# Patient Record
Sex: Male | Born: 1970 | Race: White | Hispanic: No | Marital: Single | Smoking: Former smoker
Health system: Southern US, Community
[De-identification: ages and names within clinical notes are randomized; demographics above are authoritative.]

## PROBLEM LIST (undated history)

## (undated) DIAGNOSIS — E781 Pure hyperglyceridemia: Secondary | ICD-10-CM

## (undated) DIAGNOSIS — K635 Polyp of colon: Secondary | ICD-10-CM

## (undated) DIAGNOSIS — Z9989 Dependence on other enabling machines and devices: Secondary | ICD-10-CM

## (undated) DIAGNOSIS — R7989 Other specified abnormal findings of blood chemistry: Secondary | ICD-10-CM

## (undated) DIAGNOSIS — G4733 Obstructive sleep apnea (adult) (pediatric): Secondary | ICD-10-CM

## (undated) DIAGNOSIS — K5090 Crohn's disease, unspecified, without complications: Secondary | ICD-10-CM

## (undated) HISTORY — DX: Polyp of colon: K63.5

## (undated) HISTORY — PX: COLON SURGERY: SHX602

## (undated) HISTORY — DX: Pure hyperglyceridemia: E78.1

## (undated) HISTORY — DX: Other specified abnormal findings of blood chemistry: R79.89

---

## 1999-03-23 ENCOUNTER — Encounter: Payer: Self-pay | Admitting: Emergency Medicine

## 1999-03-23 ENCOUNTER — Emergency Department (HOSPITAL_COMMUNITY): Admission: EM | Admit: 1999-03-23 | Discharge: 1999-03-23 | Payer: Self-pay | Admitting: Emergency Medicine

## 2004-03-04 ENCOUNTER — Ambulatory Visit: Payer: Self-pay | Admitting: Unknown Physician Specialty

## 2004-04-16 ENCOUNTER — Ambulatory Visit: Payer: Self-pay | Admitting: Internal Medicine

## 2004-04-16 ENCOUNTER — Inpatient Hospital Stay (HOSPITAL_COMMUNITY): Admission: EM | Admit: 2004-04-16 | Discharge: 2004-04-19 | Payer: Self-pay | Admitting: *Deleted

## 2004-04-19 ENCOUNTER — Encounter (INDEPENDENT_AMBULATORY_CARE_PROVIDER_SITE_OTHER): Payer: Self-pay | Admitting: *Deleted

## 2004-04-19 ENCOUNTER — Encounter: Payer: Self-pay | Admitting: Internal Medicine

## 2004-05-01 ENCOUNTER — Ambulatory Visit: Payer: Self-pay | Admitting: Internal Medicine

## 2004-08-05 ENCOUNTER — Ambulatory Visit: Payer: Self-pay | Admitting: Unknown Physician Specialty

## 2005-07-06 ENCOUNTER — Inpatient Hospital Stay (HOSPITAL_COMMUNITY): Admission: EM | Admit: 2005-07-06 | Discharge: 2005-07-09 | Payer: Self-pay | Admitting: Emergency Medicine

## 2005-07-09 ENCOUNTER — Ambulatory Visit: Payer: Self-pay | Admitting: Gastroenterology

## 2005-07-24 ENCOUNTER — Ambulatory Visit: Payer: Self-pay | Admitting: Internal Medicine

## 2005-08-26 ENCOUNTER — Ambulatory Visit: Payer: Self-pay | Admitting: Internal Medicine

## 2005-11-01 ENCOUNTER — Inpatient Hospital Stay (HOSPITAL_COMMUNITY): Admission: EM | Admit: 2005-11-01 | Discharge: 2005-11-04 | Payer: Self-pay | Admitting: Emergency Medicine

## 2005-11-05 ENCOUNTER — Ambulatory Visit: Payer: Self-pay | Admitting: Internal Medicine

## 2006-02-24 ENCOUNTER — Ambulatory Visit: Payer: Self-pay | Admitting: Internal Medicine

## 2006-02-24 LAB — CONVERTED CEMR LAB
AST: 60 units/L — ABNORMAL HIGH (ref 0–37)
BUN: 21 mg/dL (ref 6–23)
Bilirubin, Direct: 0.1 mg/dL (ref 0.0–0.3)
CO2: 25 meq/L (ref 19–32)
Calcium: 9.1 mg/dL (ref 8.4–10.5)
Chloride: 107 meq/L (ref 96–112)
Creatinine, Ser: 1.1 mg/dL (ref 0.4–1.5)
Eosinophils Absolute: 0 10*3/uL (ref 0.0–0.6)
GFR calc Af Amer: 99 mL/min
GFR calc non Af Amer: 82 mL/min
HCT: 39.4 % (ref 39.0–52.0)
Lymphocytes Relative: 4.8 % — ABNORMAL LOW (ref 12.0–46.0)
MCV: 90.2 fL (ref 78.0–100.0)
Monocytes Relative: 1.3 % — ABNORMAL LOW (ref 3.0–11.0)
Neutrophils Relative %: 93.1 % — ABNORMAL HIGH (ref 43.0–77.0)
Platelets: 431 10*3/uL — ABNORMAL HIGH (ref 150–400)
Potassium: 4.3 meq/L (ref 3.5–5.1)
Total Bilirubin: 0.4 mg/dL (ref 0.3–1.2)
Total Protein: 6.8 g/dL (ref 6.0–8.3)

## 2006-05-05 ENCOUNTER — Ambulatory Visit: Payer: Self-pay | Admitting: Internal Medicine

## 2006-05-05 LAB — CONVERTED CEMR LAB
Basophils Absolute: 0 10*3/uL (ref 0.0–0.1)
Basophils Relative: 0.1 % (ref 0.0–1.0)
Eosinophils Absolute: 0.2 10*3/uL (ref 0.0–0.6)
Neutro Abs: 5.9 10*3/uL (ref 1.4–7.7)
Platelets: 333 10*3/uL (ref 150–400)

## 2006-05-11 ENCOUNTER — Observation Stay (HOSPITAL_COMMUNITY): Admission: EM | Admit: 2006-05-11 | Discharge: 2006-05-12 | Payer: Self-pay | Admitting: Emergency Medicine

## 2006-05-19 ENCOUNTER — Ambulatory Visit: Payer: Self-pay | Admitting: Internal Medicine

## 2006-05-22 ENCOUNTER — Ambulatory Visit: Payer: Self-pay | Admitting: Internal Medicine

## 2006-06-16 ENCOUNTER — Ambulatory Visit: Payer: Self-pay | Admitting: Internal Medicine

## 2006-06-16 LAB — CONVERTED CEMR LAB
Basophils Absolute: 0.2 10*3/uL — ABNORMAL HIGH (ref 0.0–0.1)
Eosinophils Relative: 0.1 % (ref 0.0–5.0)
HCT: 39.3 % (ref 39.0–52.0)
Hemoglobin: 13.9 g/dL (ref 13.0–17.0)
MCHC: 35.3 g/dL (ref 30.0–36.0)
MCV: 90.2 fL (ref 78.0–100.0)
Monocytes Absolute: 0.5 10*3/uL (ref 0.2–0.7)
Monocytes Relative: 3.1 % (ref 3.0–11.0)
RBC: 4.35 M/uL (ref 4.22–5.81)

## 2006-06-23 ENCOUNTER — Ambulatory Visit: Payer: Self-pay | Admitting: Internal Medicine

## 2006-07-13 ENCOUNTER — Ambulatory Visit: Payer: Self-pay | Admitting: Internal Medicine

## 2006-09-17 ENCOUNTER — Inpatient Hospital Stay (HOSPITAL_COMMUNITY): Admission: EM | Admit: 2006-09-17 | Discharge: 2006-09-22 | Payer: Self-pay | Admitting: Emergency Medicine

## 2006-09-18 ENCOUNTER — Encounter (INDEPENDENT_AMBULATORY_CARE_PROVIDER_SITE_OTHER): Payer: Self-pay | Admitting: Surgery

## 2006-09-24 ENCOUNTER — Ambulatory Visit: Payer: Self-pay | Admitting: Internal Medicine

## 2006-10-12 ENCOUNTER — Inpatient Hospital Stay (HOSPITAL_COMMUNITY): Admission: EM | Admit: 2006-10-12 | Discharge: 2006-10-16 | Payer: Self-pay | Admitting: Emergency Medicine

## 2007-06-12 DIAGNOSIS — K5090 Crohn's disease, unspecified, without complications: Secondary | ICD-10-CM | POA: Insufficient documentation

## 2007-06-12 DIAGNOSIS — G4730 Sleep apnea, unspecified: Secondary | ICD-10-CM | POA: Insufficient documentation

## 2008-10-17 ENCOUNTER — Encounter: Admission: RE | Admit: 2008-10-17 | Discharge: 2008-10-17 | Payer: Self-pay | Admitting: Internal Medicine

## 2009-01-21 ENCOUNTER — Emergency Department (HOSPITAL_COMMUNITY): Admission: EM | Admit: 2009-01-21 | Discharge: 2009-01-21 | Payer: Self-pay | Admitting: Emergency Medicine

## 2010-06-11 NOTE — Discharge Summary (Signed)
[name], [name]              ACCOUNT NO.:  [id number]   MEDICAL RECORD NO.:  [id number]          PATIENT TYPE:  OBV   LOCATION:  1504                         FACILITY:  [facility]   PHYSICIAN:  Iva Boop, MD,FACGDATE OF BIRTH:  1970/06/02   DATE OF ADMISSION:  05/11/2006  DATE OF DISCHARGE:  05/12/2006                               DISCHARGE SUMMARY   ADMITTING DIAGNOSES:  1. [age]-year-old white male with known Crohn's ileitis with      stricture at previous anastomosis and 1 other segment of narrowing      in the distal small bowel, who presents with abdominal pain, rule      out recurrent obstruction secondary to active Crohn's versus      cicatrization.  2. Status post ileocectomy 19 years ago.  3. History of medical noncompliance.   DISCHARGE DIAGNOSES:  1. Abdominal pain secondary to active ileitis with low-grade      obstructive symptoms.  2. Other diagnoses as listed above.   CONSULTATIONS:  None.   PROCEDURES:  CT enterography.   BRIEF HISTORY:  [name] is a 40 year old white male known to Dr. Marina Goodell  with history of ileal Crohn's disease.  He is status post remote  ileocectomy done about 19 years ago.  He has had problems with recurrent  partial small-bowel obstruction.  He was felt to have a combination of  fixed stenotic disease and superimposed inflammatory disease.  He also  has a history of noncompliance with medications.  His last course of  prednisone was started in January 2008 and he was tapered off by mid  March.  He around that time apparently also ran out of 6-MP and had  been off of this for about 2 weeks.  He had seen Dr. Marina Goodell back in the  office and started back on the 6-MP on April 8 and says he has been  doing well.  On April 11, he had onset of right mid quadrant abdominal  pain, which he says feels like his Crohn's pain and obstructive pain.  This has persisted over the past 4 days, has been intermittent and wave-  like, no  associated nausea or vomiting.  He has had loose stools without  melena or hematochezia.  He has placed himself on a liquid diet over  this past weekend and then called our office with the above symptoms; he  was asked to come to the emergency room for further evaluation and x-  rays to rule out recurrent obstruction.   LABORATORY STUDIES:  On April 14, WBC of 12.2, hemoglobin 14.2,  hematocrit of 39.8, MCV of 86, platelets 376,000; electrolytes within  normal limits; potassium was 3.5, glucose 91, creatinine 0.87; GFR  greater than 60; albumin 4; UA negative.   X-RAY STUDIES:  CT enterography shows evidence of active Crohn's in the  terminal ileum with low-grade obstruction in that area and a second  possible transition in the distal ileum immediate adjacent to the  inflamed terminal ileum; component of small-bowel-to-small-bowel fistula  cannot be entirely excluded in this area.  Pelvis was negative.   HOSPITAL COURSE:  The patient was admitted to the service of Dr. Stan Head.  He was initially placed on a clear liquid diet, given  antibiotics and narcotics as needed, continued on 6-MP at 200 mg daily  and scheduled for CT enterography; this was done on the day of admission  with findings as outlined above.  The following morning, he was feeling  better and felt that he could control his pain with oral medications at  home.  He had not had any nausea or vomiting.  He was allowed discharge  to home with instructions to stay on a soft low-residue diet, to  continue 6-MP at 200 mg per day.  We added prednisone 40 mg q.a.m. due  to what looks like active ileitis on the CT enterography and also gave  him Vicodin 5/500 q.6 h. p.r.n. pain.   FOLLOWUP:  He is to follow up with Dr. Marina Goodell in the office on April 25  at 8:45 a.m., to call for any problems in the interim.      Jay Esterwood, PA-C      Iva Boop, MD,FACG  Electronically Signed    AE/MEDQ  D:  06/16/2006  T:   06/16/2006  Job:  [id number] [id number]

## 2010-06-11 NOTE — H&P (Signed)
NAMEHOMAR, WEINKAUF              ACCOUNT NO.:  [id number]   MEDICAL RECORD NO.:  [id number]          PATIENT TYPE:  INP   LOCATION:  0105                         FACILITY:  [facility]   PHYSICIAN:  Rachael Fee, MD   DATE OF BIRTH:  1970-09-20   DATE OF ADMISSION:  10/12/2006  DATE OF DISCHARGE:                              HISTORY & PHYSICAL   PROBLEM:  Acute abdominal pain, nausea and vomiting.   HISTORY:  Dai is a pleasant 40 year old white male known to Dr.  Marina Goodell who has history of Crohn's ileitis.  He had a remote ileocecectomy  about 19 years ago.  He developed a stricture at the previous  anastomosis and developed recurrent small bowel obstructions.  He  underwent laparoscopic lysis of adhesions on September 18, 2006 by Dr.  Michaell Cowing and also had lap assisted ileocolonic resection and anastomosis.  He resumed his 6-MP at 200 mg per day at the time of discharge on September 22, 2006.  He had been on steroids perioperatively in these were tapered  off.  He was not felt to have any active Crohn's at the time of surgery.  The patient says that he was doing well with no problems until last  evening when he developed mid abdominal pain which gradually progressed  and was associated with nausea and vomiting.  After vomiting briefly, he  felt better and then his symptoms recurred and he developed increased  abdominal distention, bloating and came to the emergency room.  He says  he was having normal bowel movements up to yesterday.  Has not had any  bowel movement today or any flatus.  No fever, chills, etc..  At this  time he is admitted through the ER for pain control and further  diagnostic evaluation as well as surgical consultation.  CT findings at  the time of admission show wall thickening and edema, enteric post right  hemicolectomy.  Also has a 4.4 x 2.7 cm low density fluid collection  along a loop of small bowel,  question within the bowel wall which may  represent a small  abscess.  Small bowel loops were distended proximal to  this area.  Labs show a WBC of 13.8, hemoglobin 14.3, hematocrit of  40.9, potassium 3.3, creatinine 0.9.  Liver function studies normal.   CURRENT MEDICATIONS:  1. He is on Percocet on a p.r.n. basis which has not been requiring.  2. Magnesium 400 mg daily.  3. 6-MP at 200 mg daily.   ALLERGIES:  NO KNOWN DRUG ALLERGIES.   FAMILY HISTORY:  He has a sister with mild Crohn's.   SOCIAL HISTORY:  The patient lives with his mother.  He is a smoker,  half-pack per day and no EtOH.  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  He was hoping to go back to work within the  next 1 to 2 weeks.   REVIEW OF SYSTEMS:  GI: As outlined above.  MUSCULOSKELETAL:  Negative.  CARDIOVASCULAR:  Denies any chest pain or anginal symptoms.  PULMONARY:  Negative for cough, shortness  of breath or sputum production.  GENITOURINARY:  Negative dysuria, urgency or frequency.  All other  review of systems negative.   PAST HISTORY:  1. Pertinent for Crohn's ileitis as outlined above.  2. Obesity.  3. History of sleep apnea.   PHYSICAL EXAMINATION:  GENERAL:  Well-developed obese white male in no  acute distress.  He is alert and oriented x3, uncomfortable.  VITAL SIGNS:  Temperature is 97.6, blood pressure 109/58, pulse in the  90s, sats 96 on room air.  HEENT:  Nontraumatic, normocephalic.  EOMI, PERRLA.  Sclerae anicteric.  NECK:  Neck is supple without nodes.  CARDIOVASCULAR:  Regular rate and rhythm with S1 and S2.  There is no  murmur or gallop.  PULMONARY:  Clear to A%P.  ABDOMEN:  Somewhat distended.  Bowel sounds are present but hypoactive.  He has diffusely tender.  No real focal tenderness.  There is no mass or  hepatosplenomegaly.  He does have a right transverse abdominal  incisional scar which is of benign-appearing RECTAL:  Not done at this  time.  NEUROLOGIC:  Grossly nonfocal.  EXTREMITIES:  Without clubbing,  cyanosis or edema.   IMPRESSION:  1. A 13 old white male with known of Crohn's ileitis, quiescent with 6-      MP who is status post a remote ileocecectomy approximately 19 years      ago and then subsequent anastomotic stricture with history of      recurrent small bowel obstructions.  He underwent lap assisted      lysis of adhesions and ileocolonic resection September 18, 2006, and      now presents with partial small-bowel obstruction and question of      an inner loop abscess.  2. Obesity.  3. Sleep apnea.   PLAN:  The patient is admitted to the service, Dr. Christella Hartigan for pain  control.  He will be kept NPO.  Suspect he will require an NG tube but  wants to wait over the next few hours to see if he has any further  vomiting.  Will start him on IV Cipro and IV Flagyl.  Hold his 6-MP  in  the event that he may need surgery and have obtained surgical  consultation.  Thank you this is a mastoid PAC dictated for Dr. Christella Hartigan.      Mike Gip, PA-C      Rachael Fee, MD  Electronically Signed    AE/MEDQ  D:  10/12/2006  T:  10/12/2006  Job:  [id number]

## 2010-06-11 NOTE — Assessment & Plan Note (Signed)
[facility]                         GASTROENTEROLOGY OFFICE NOTE   NAME:[name], [name]                     MRN:          [id number]  DATE:07/13/2006                            DOB:          1970/09/27    HISTORY:  [name] presents today for followup. He was last evaluated  May 22, 2006, for ongoing management of his ileal Crohn's disease. He  has had problems with right lower quadrant pain and intermittent  obstructive symptoms. At the time of his last visit, he was on 200  mg daily. He was also on prednisone 40 mg daily, which was to be tapered  by 10 mg every two weeks. His last CBC on Jun 16, 2006, was  unremarkable. He follows up at this time.   After coming off of prednisone, he had recurrent problems with pain for  which he placed himself on prednisone 20 mg daily. He did not contact  the office and thought he would wait until this visit to discuss his  maneuver. He has been on 20 mg daily for a few weeks. Despite this, he  still has intermittent discomfort in the right lower quadrant. Mild  nausea, no vomiting. Bowel habits are regular.   PHYSICAL EXAMINATION:  Finds a well-appearing male in no acute distress.  Blood pressure 100/60, heart rate 76, weight is 299 pounds.  HEENT: Sclerae anicteric.  LUNGS:  Are clear.  HEART: Is regular.  ABDOMEN: Soft with tenderness in the right lower quadrant to palpation.  No masses felt. Good bowel sounds heard.   IMPRESSION:  1. Active ileal Crohn's with partial small bowel obstruction due to a      combination of fixed stenotic disease as well as inflammatory      disease.  2. Status post ileo-cecectomy remotely.  3. History of medical noncompliance.   RECOMMENDATIONS:  1. Increase prednisone to 40 mg daily due to persistent symptoms.  2. Check thiopurine metabolites as well as CBC today.  3. Taper prednisone by 10 mg every two weeks.  4. Office followup in six weeks.  5. Discussion  today regarding biologic therapies including Remicade      and Humira as this may be the next step, short of surgery.      Literature with a CD on Humira provided for his review.     Wilhemina Bonito. Marina Goodell, MD  Electronically Signed   JNP/MedQ  DD: 07/13/2006  DT: 07/13/2006  Job #: [id number]   cc:   Velora Heckler, MD

## 2010-06-11 NOTE — H&P (Signed)
NAMEBRONSON, [name]              ACCOUNT NO.:  [id number]   MEDICAL RECORD NO.:  [id number]          PATIENT TYPE:  INP   LOCATION:  1310                         FACILITY:  [facility]   PHYSICIAN:  Wilhemina Bonito. Marina Goodell, MD      DATE OF BIRTH:  May 25, 1970   DATE OF ADMISSION:  09/16/2006  DATE OF DISCHARGE:                              HISTORY & PHYSICAL   CHIEF COMPLAINT:  Nausea, vomiting, consistent with recurrent partial  small-bowel obstruction.   HISTORY OF PRESENT ILLNESS:  [name] is a 40 year old gentleman who  has a long history of Crohn's involving the small bowel.  He underwent  an ileocolectomy about 20 years previously.  In the past couple of  years, he has required hospital admissions for partial small-bowel  obstructions.  This is secondary to both fixed and inflammatory ileal  disease.  The patient has been on and off medications but recently has  been compliant with at 200 mg daily and prednisone 20 mg daily.  The  patient has developed abdominal pain, nausea and vomiting yesterday, and  the symptoms were consistent with recurrent partial small-bowel  obstruction, so he came to the hospital emergency at [facility] and was  admitted by Dr. Yancey Flemings.  ER physicians had obtained a CT scan which  showed partial low grade distal small bowel obstruction and some  stranding in the region of the terminal ileum associated with ileal wall  thickening suggestive of active Crohn's disease.   The patient's latest colonoscopy was performed April 19, 2004 when he  was hospitalized with a Crohn's flare.  I do not have that report  available, but later office notes with Dr. Marina Goodell note that he had both  fixed stenotic disease as well as superimposed inflammatory disease in  the ileum.  Recent thiopurine metabolites were acceptable range.   ALLERGIES:  NO KNOWN DRUG ALLERGIES.   CURRENT MEDICATIONS:  1. 200 mg p.o. daily.  2. Prednisone 20 mg p.o. daily.   PAST MEDICAL  HISTORY:  1. History of small-bowel Crohn's disease.  Status post ileocolectomy      in the 1980s.  2. Obesity.  3. Obstructive sleep apnea.  In the past, he has used CPAP      nocturnally, but he has not used this in many years.  4. Ongoing tobacco abuse.   FAMILY HISTORY:  He has a sister who has mild Crohn's disease.  She is  not on medicines, and she has never required surgery.   SOCIAL HISTORY:  The patient lives with his mother in [place].  He  smokes a few cigarettes a day.  He does not drink alcoholic beverages.   REVIEW OF SYSTEMS:  No headaches.  No visual changes.  No shortness of  breath.  No cough.  No significant weight loss of more than 5 pounds.  His bowels generally moving about 2-3 times a day and are soft and  sometimes looser.  He has not had a bowel movement since the afternoon  of August 21 which is the day of admission.  There has been no  blood and  his stools.  Next, there are no rashes, no unusual dermatologic or ENT  bleeding.  No dizziness, no presyncope.  No weakness.  No fevers, no  chills.  No dysuria, no urinary frequency.   PHYSICAL EXAMINATION:  Temperature is 99.8, blood pressure 131/67, pulse  is 85, respirations 20, temperature 96%.  The patient is an obese,  pleasant, white male who is in no distress currently.  He appears well.  HEENT EXAM:  Sclerae are nonicteric.  Conjunctiva is pink.  Extraocular  movements are intact.  Oropharynx:  The oral mucosa is moist and clear.  Dentition in good repair.  NECK:  There is no JVD, no masses, no thyromegaly.  The neck is large  overall.  PULMONARY:  The chest is clear to auscultation and percussion  bilaterally.  There is no dyspnea with speech or exertion.  CARDIOVASCULAR:  There is regular rate and rhythm.  No murmurs, rubs or  gallops.  ABDOMEN:  Is obese.  Bowel sounds are active.  There is tenderness  without guarding or rebound in the right lower quadrant.  No masses, no  bruits, no  hepatosplenomegaly.  RECTAL EXAM AND GENITOURINARY EXAMS:  Were deferred.  EXTREMITIES:  There was no cyanosis, clubbing or edema.  Dorsalis pedal  pulses 3+ bilaterally.  PSYCHIATRIC:  The patient is cooperative.  Does not appear depressed.  NEUROLOGIC:  There is no tremor.  The patient moves all fours easily.  Strength in the upper and lower extremities is full.   LABORATORY DATA:  Lipase level 20.  Sodium 138, potassium 3.7, chloride  103, CO2 27.  Glucose 105, BUN 10, creatinine 1.0, total bilirubin 0.9,  alkaline phosphatase 53, AST 38, ALT 72.  Albumin 4.4.  White blood cell  count is 15 with left shift of 82% neutrophils, 13% lymphocytes, 5%  monocytes, no eosinophils or basophils present.  Hemoglobin is 14.6,  hematocrit 41.8.  MCV 90 and platelet count 363.   IMPRESSION:  Recurrent partial small-bowel obstruction secondary to  recurrent ileal Crohn's with evidence for ileal stricturing, both fixed  and inflammatory.  He has failed medical therapy at this point.  The  patient and physician have had extensive discussions on biological  therapy versus surgery.  Will plan to get surgical evaluation as this is  favored.  The patient being admitted for bowel rest, IV fluids,IV  steroids and pain control.      Jay Moccasin, PA-C      Wilhemina Bonito. Marina Goodell, MD  Electronically Signed    SG/MEDQ  D:  09/17/2006  T:  09/18/2006  Job:  [id number]

## 2010-06-11 NOTE — Op Note (Signed)
[name], [name]              ACCOUNT NO.:  [id number]   MEDICAL RECORD NO.:  [id number]          PATIENT TYPE:  INP   LOCATION:  1310                         [facility]AC[facility]   PHYSICIAN:  Ardeth Sportsman, MD     DATE OF BIRTH:  1970-12-01   DATE OF PROCEDURE:  09/18/2006  DATE OF DISCHARGE:                               OPERATIVE REPORT   SURGEON:  Ardeth Sportsman, M.D.   PREOPERATIVE DIAGNOSIS:  Crohn's disease with recurrent partial small  bowel obstruction secondary to chronic ileal stricture.   POSTOPERATIVE DIAGNOSIS:  Crohn's disease with recurrent partial small  bowel obstruction secondary to chronic ileal stricture.   PROCEDURE PERFORMED:  1. Laparoscopic lysis of adhesions x90 minutes.  2. Laparoscopically-assisted ileocolonic resection with anastomosis.   ANESTHESIA:  1. General.  2. Local anesthetic in a field block around all port sites and at      fascial closure.   DRAINS:  None.   ESTIMATED BLOOD LOSS:  200 mL.   COMPLICATIONS:  No major complications.   INDICATIONS FOR PROCEDURE:  Mr. [name] is a 40 year old gentleman who  has had a history of Crohn's for several decades.  He had an urgent  ileocecectomy done when he was a 40 year old at [facility] [place].  He has not had many symptoms until a few  years ago, he started having intermittent episodes of partial small  bowel obstruction.  He has been followed by Dr. Yancey Flemings with [facility].  The patient has had bowel obstructions and they have  usually been steroid responsive, however, he has had increasing episodes  and not able to wean off steroids and 6-mecaptopurine.  Small bowel  follow through showed a long stricture.  Based on these concerns,  options were discussed, and recommendation was made for surgical  resection.  The risks of stroke, heart attack, deep venous thrombosis,  pulmonary embolism, and death were discussed.  The risks such as  bleeding, need for transfusion, wound infection, abscess, injury to  other organs, prolonged pain, incisional hernia, and anastomotic leak  resulting in ileostomy, and other risks were discussed in detail.  Questions were answered and he agreed to proceed.   OPERATIVE FINDINGS:  He had dense adhesions of omentum to his anterior  abdominal wall as well as to his interloop adhesions but no interloop  fistulae.  He had some dense adhesions in his right lower quadrant.  He  had a segment of about 20 cm of his terminal ileum that was thickened  with creeping fat consistent with active Crohn's disease.  He had  thickness, especially in his ileocolonic anastomosis.  The rest of his  colon and small bowel proximal to that were all normal.   DESCRIPTION OF PROCEDURE:  Informed consent was confirmed.  He was  positioned supine with both arms tucked.  He underwent general  anesthesia without any difficulty.  He had orogastric tube placed.  A  Foley catheter was sterilely placed.  He had sequential compressive  devices active during the entire case.  His abdomen was prepped and  draped in a sterile fashion.  Entry was gained in the abdomen with the  patient in steep reversed Trendelenburg left side up.  A 5 mm port was  placed in the left upper quadrant using optical entry with a 5 mm/0  degree scope.  Capnoperitoneum to 15 mmHg to provide good abdominal  insufflation.  Under direct visualization, 5 mm ports were placed in the  left flank/left lower quadrant/left suprapubic region and infraumbilical  area.   He had some dense adhesions of his greater omentum to the anterior  abdominal wall.  These were freed with sharp and controlled blunt  dissection.  He had some adhesions of his omentum to his thickened small  intestine, as well, and these were freed off carefully.  The mid jejunum  was followed distally to the terminal ileum and a thickened loop of  small  bowel was encountered with dense  adhesions to the right lower  quadrant abdominal wall and pelvic brim.  These were freed off sharply.  At one point, I did have a partial nick of a tubular structure.  Indigo  carmine was administered and after 30 minutes, he had blue urine and no  evidence of leak from the ureter.  A hand port was placed in the right  midabdomen through a prior transverse incision.  Inspection revealed the  tubular structure was consistent with a gonadal vein.  Hemostasis was  good.  His ureter was later able to be located in the deep fatty  retroperitoneal structures intact and not injured.  I was able to follow  this up to his renal hilum, as well, without doing any skeletonization  of his ureter.   The right colon up to the mid transverse colon was freed in a lateral to  medial fashion and some greater omentum was freed off, as well.  This  allowed excellent colonic mobilization.  The small bowel was run again  from the ileocolonic anastomosis to the ligament of Treitz and there was  no evidence of any other disease and no other twisting or torsion of the  bowel.  Because of improved mobilization, I was able to exteriorize the  colon through the hand point through his prior transverse abdominal  incision in the right mid abdomen.  About 20 cm of distal ileum and  ileocolonic anastomosis was resected and stapled in a side-to-side  fashion using a 75 mm stapled endo-GIA.  His mesentery was taken using  LigaSure as well as interrupted 2-0 and 0 silk ties on the mesentery  side.  His ileal mesentery was extremely thickened and vascular.  This  is where most of his blood loss occurred in trying to ligate this area.  The resulting staple defect was closed using a TA-60 and the staple line  was imbricated using interrupted silk stitches to good effect.  The  staple lines were staggered to avoid a four point weak area.  The  ileocolonic mesenteric defect was closed using interrupted silk stitches  to good  effect.  The digestive tract was returned to the abdomen.   Copious irrigation of over 5 liters of saline was done and ultimately  had good clear return.  Again, inspection was made of the  retroperitoneal structures and mesentery.  There were no mesenteric  defects.  There was no evidence of any bleeding.  There was not blue  staining of indigo carmine that would occur from a ureteral (or other  genitourinary structure) leak.  Capnoperitoneum was evacuated.  The  ports were removed.  The fascial defect was closed using #1 PDS in a  running fashion to a good result.  Finger and laparoscopic inspection  revealed no intra-abdominal injury or tacking of the small bowel.  The  wounds were copiously irrigated with saline.  The right transverse  abdominal incision was closed at the skin with interrupted staples over  a 1/4 inch Penrose drain in the deep subcutaneous tissues.  Sterile  dressing was applied.  The patient was extubated and taken to the  recovery room in stable condition.   I explained the operative findings to the patient's mother.  Postoperative instructions were discussed in detail.  Questions were  answered and she expressed understanding and appreciation.      Ardeth Sportsman, MD  Electronically Signed     SCG/MEDQ  D:  09/18/2006  T:  09/19/2006  Job:  [id number]   cc:   Wilhemina Bonito. Marina Goodell, MD  [address]  [place]  [place] [address]

## 2010-06-11 NOTE — Consult Note (Signed)
Jay, Reed              ACCOUNT NO.:  [id number]   MEDICAL RECORD NO.:  [id number]          PATIENT TYPE:  INP   LOCATION:  1310                         FACILITY:  [facility]   PHYSICIAN:  Ardeth Sportsman, MD     DATE OF BIRTH:  08/27/1970   DATE OF CONSULTATION:  DATE OF DISCHARGE:                                 CONSULTATION   PRIMARY CARE PHYSICIAN:  None available.  Gastroenterologist who  functions as his primary care physician is Dr. Yancey Flemings with [facility].   SURGEON:  Dr. 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   HISTORY OF PRESENT ILLNESS:  [name] is a 40 year old morbidly-obese  male who has been struggling with Crohn's for most of his life.  He  ended up having an ileal resection at [facility] in [place]  when he was 40 years old.  He has not had much symptoms for the past 20  years.  However, the past few years he started having intermittent bouts  of abdominal pain.  He has been bolused on steroids and then been able  to wean off.  However, he is having more frequent and more intense  episodes this past year, especially the last few months.  He has already  been admitted twice - this is his fourth admission in a little over 12  months.   The patient notes history after eating a little bit of solid food he  started having, again, recurrence of the abdominal pain primarily in the  right lower quadrant with some nausea.  He did not throw up this time,  but given the fact he knows that this happens he switched over to  liquids.  The symptoms did not improve.  He was admitted by Dr. Marina Goodell of  [facility].  He had been started on 6-mercaptopurine  earlier in the year and was bolused on steroids but he has not been able  to get down below 20 mg a day.  He has a evaluation with an ileal  stricture at least 10 cm  long by CT enterography a few months ago and  perhaps two close distal ileal tight regions on a small-bowel follow-  through earlier in the year as well.   Based on concerns of worsening of recurrent symptoms on two  immunosuppressive medications with a known long stricture, Dr. Marina Goodell  requests surgical consultation.  He has been seen by Dr. Darnell Level in  the past but Dr. Gerrit Friends asked that I see the patient for him since he  will not be available for the next week.   PAST MEDICAL HISTORY:  1. Crohn's disease.  2. Morbid obesity.  3. Tobacco abuse.  4. History of poor compliance with management of his ileal disease in      the past 2 years, improving slightly this year.   PAST SURGICAL HISTORY:  He had exploratory laparotomy with ileal  resection and anastomosis at age 40.  No other abdominal surgeries.   ALLERGIES:  None.  MEDICATIONS:  He has been taking prednisone 20 mg daily, and he has been  taking 6-mercaptopruine, I believe 200 mg daily, as well.   ALLERGIES:  None.   SOCIAL HISTORY:  He has probably about a 20 pack-year history of  tobacco.  He claims he only smokes about a pack per day.  He is single.  He lives with his mother, who is here today at the bedside.  Does not  really drink any alcohol, no other drug use.   FAMILY HISTORY:  Negative for any inflammatory bowel disease or other  gastroenterologic disorder or any other abnormalities.   REVIEW OF SYSTEMS:  As known per HPI.  CONSTITUTIONAL:  Negative for any  fevers, chills, sweats, weight gain or weight loss.  He has had some  decreased appetite.  VISUAL:  No visual changes or difficulty with  blindness or other difficulties.  ENT:  He has had a mild sore throat  but no ear pain or discharge.  CARDIOVASCULAR:  Negative for any chest  pain or exertional chest pain or shortness of breath.  Respiratory,  genitourinary, musculoskeletal, dermatologic, psychiatric, neurologic,  heme/lymph, allergic otherwise  negative.  GI:  As noted above.  No  history of any hematochezia or melena.  He has not had a colonoscopy  done recently.   VITAL SIGNS:  His temperature is 98.0, pulse 81, respirations 18, blood  pressure 126/74, 98% saturation on range of motion air.  GENERAL:  He is a well-developed, well-nourished, morbidly-obese male in  no acute distress.  PSYCHIATRIC:  He seems pleasant and interactive with no evidence seen of  dementia, delirium, psychosis, or paranoia.  NEUROLOGIC:  Cranial nerves II-XII are intact.  Hand grips 5/5, equal  and symmetrical.  No resting or intention tremors.  EYES:  Pupils equal, round, and reactive to light.  Extraocular  movements are intact.  Sclerae are not icteric or injected.  ENT:  Normocephalic with no facial asymmetry.  Mucous membranes are  moist.  Nasopharynx and oropharynx clear.  NECK:  Supple without any mass.  The trachea is midline.  HEART:  Regular rate and rhythm.  No murmurs, clicks, rubs.  CHEST:  Clear to auscultation bilaterally.  No wheezes, rales, rhonchi.  ABDOMEN:  Obese but soft, maybe slightly distended.  Some mild  discomfort in his right lower quadrant but certainly no peritoneal sign  or guarding.  GENITAL:  Normal external male genitalia.  Testes descended bilaterally  with no inguinal hernias.  RECTAL:  Deferred per patient request.  EXTREMITIES:  No clubbing, cyanosis, or edema.  No evidence of any  musculoskeletal deformity.  Good range of motion of shoulders, elbows,  wrists as well hips, knees and ankles.  No evidence of any joint  effusions or arthritis or tenderness.  SKIN:  No obvious petechia or purpura.  No other sores or lesions.  No  pretibial skin changes.   LABORATORY VALUES:  His CBC shows a white count of 14.3 which is  slightly elevated, but is improved from 15 yesterday.  Hemoglobin around  14.6.  Lipase is 20.  His comprehensive metabolic panel is essentially  normal except for a slightly elevated AST/ALT of  38 and 72.  CT scan of  the abdomen and pelvis notes some slightly dilated small-bowel loops in  the mid and distal intestine with a possible transition point in the  right lower quadrant with some small-bowel wall thickening consistent  with a known stricture.  I did review his small-bowel  follow-through and  CT enterography within the past 12 months, once again shows similar  findings.   ASSESSMENT AND PLAN:  A 40 year old male with history of Crohn's disease  who is on two immunosuppressants and has not been able to wean off his  steroids, with recurrent bowel obstruction symptoms and a long ileal  stricture.   The pathophysiology of Crohn's disease was discussed and anatomy and  physiology of the digestive tract was explained.  Options of medical  management were discussed.  Discussion was made on surgical  intervention.   Given the fact that he has a rather long stricture; he is having  symptoms on two immunosuppressives; he has not been able to be weaned  off his prednisone; and, he has had worsening and more intense and more  frequent symptoms, I think he would benefit from ileal resection.   I agree with Dr. Lamar Sprinkles approach in trying to optimize medical  management and not rush to surgery in these cases, but I feel that this  segment has demonstrated its poor ability to respond to  immunosuppression, given its length and given its rather longer segment.   Risks such as stroke, heart attack, deep venous thrombosis, pulmonary  embolism, and death were discussed.  Risks such as bleeding, need for  transfusion, wound infection, abscess, incisional hernia, anastomotic  leak resulting in need of ileostomy and re-resection, the possibility of  recurrence of disease requiring further intervention and the probable  need for long-term immunosuppression was discussed as well.  Questions  answered and he and his mother agree to proceed.      Ardeth Sportsman, MD  Electronically  Signed     SCG/MEDQ  D:  09/17/2006  T:  09/18/2006  Job:  [id number]   cc:   Wilhemina Bonito. Marina Goodell, MD  [address]  [place]  [place] [address]

## 2010-06-11 NOTE — Discharge Summary (Signed)
Jay, Reed              ACCOUNT NO.:  [id number]   MEDICAL RECORD NO.:  [id number]          PATIENT TYPE:  INP   LOCATION:  1310                         FACILITY:  [name]   PHYSICIAN:  Jay Sportsman, MD     DATE OF BIRTH:  Aug 18, 1970   DATE OF ADMISSION:  09/16/2006  DATE OF DISCHARGE:  09/22/2006                               DISCHARGE SUMMARY   PRIMARY CARE PHYSICIAN:  Not available.   GASTROENTEROLOGIST:  [name], M.D.   SURGEON:  [name], M.D.   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   OTHER DIAGNOSES:  1. Morbid obesity.  2. Tobacco abuse.  3. History of poor medical compliance in the past.   PROCEDURES PERFORMED:  Laparoscopic lysis of adhesions and  laparoscopically assisted small ileal resection with side-to-side  anastomosis on September 18, 2006.   SUMMARY OF HOSPITAL COURSE:  [name] is a 40 year old morbidly obese  male who had evidence of Crohn's disease with a small bowel resection  and anastomosis at age 40.  He has had worsening symptoms with known  ileal stricture.  He has had more frequent and more severe readmissions  for bowel obstruction and was concerned for a steroid-resistant  stricture.  Options were discussed and after being admitted by  Gastroenterology Service, he was taken to the operating room on hospital  day #3 for laparoscopic lysis of adhesions and resection of the new  terminal ileum including the ileocolonic anastomosis.  Postoperatively,  he had some ileus, but by postoperative day #4, he was having flatus and  tolerating a solid diet.  He was transitioned over to oral pain  medications that appeared to be adequate.  He was transitioned over from  IV Solu-Medrol to oral steroids.  He had a Penrose wound drain which was  discontinued on discharge day and his incision looked well with no  evidence of any cellulitis and no problems of  his other incisions.  His  pain was adequately controlled on oral pain medications.   Based on these improvements, we felt it was reasonable for him to be  discharged home with the following instructions.  1. He is to follow up with myself and [name] in 7-10      days for overall followup and to remove his staples at his incision      and for a wound check.  2. He is to follow up with [name] on      September 24 at 8:30 a.m. at [name] for long-term      Crohn's management.   DISCHARGE MEDICATIONS:  1. 6-MP 200 mg daily.  2. Prednisone taper, 20 mg daily for one week, then 10 mg daily for      one week, then 5 mg daily for one week and then stop (per [facility]).  3. Magnesium oxide 400 mg daily for one week.  4. Percocet 5/325 one to two p.o. q.4h. p.r.n. pain.  5. Tylenol p.r.n.  pain.  6. Ibuprofen p.r.n. pain.  7. Ice packs or heating pads p.r.n. pain.   He should call if he has any worsening fevers, chills, sweats, nausea,  vomiting, worsening abdominal pain, uncontrolled diarrhea or  constipation or other concerns.   Questions answered and he agrees with this plan.   Given the fact that he has heavy duty activity at his work, he should be  off work for at least 4 weeks to allow himself to recover and avoid  development of incisional hernias and other issues given the fact that  he is on steroids, morbidly obese and required an urgent Reed.  I  have completed followup paperwork for his work as well.      Jay Sportsman, MD  Electronically Signed     SCG/MEDQ  D:  09/22/2006  T:  09/22/2006  Job:  [id number]   cc:   Jay Bonito. Marina Goodell, MD  [address]  [place]  [place] [address]

## 2010-06-14 NOTE — H&P (Signed)
[name], [name]              ACCOUNT NO.:  [id number]   MEDICAL RECORD NO.:  [id number]          PATIENT TYPE:  INP   LOCATION:  5702                         FACILITY:  [facility]   PHYSICIAN:  Della Goo, M.D. DATE OF BIRTH:  12-11-1970   DATE OF ADMISSION:  10/31/2005  DATE OF DISCHARGE:                                HISTORY & PHYSICAL   CHIEF COMPLAINT:  Abdominal pain.   HISTORY OF PRESENT ILLNESS:  A 40 year old male presenting to the emergency  room with severe abdominal pain which he describes as being crampy, 10/10  across abdomen.  The patient has a history of Crohn's disease, had recently  been on a long steroid taper 2 weeks ago.  He denies having any nausea,  vomiting, denies passing any loose stools at this time.  The patient reports  prior to this year having only once a year flare-ups of his Crohn's disease.  He reports this year he has had more flare-ups.  He sees Dr. Marina Goodell,  gastroenterologist.  The patient denies having any fevers, chills, nausea,  vomiting, night sweats, weight loss, hematochezia, melena or hematemesis.   PAST MEDICAL HISTORY:  Crohn's disease.  No other medical problems.   MEDICATIONS:  None.   ALLERGIES:  NONE.   SOCIAL HISTORY:  Quit smoking in March of this year.  Rare alcohol usage.   REVIEW OF SYSTEMS:  Pertinents are mentioned above.   PHYSICAL EXAMINATION FINDINGS:  A 40 year old obese male in discomfort but  no acute distress.  VITAL SIGNS:  Temperature 98.3, blood pressure 132/87, heart rate 85,  respirations 18-28, O2 saturations 93-95%.  HEENT:  Normocephalic, atraumatic.  Pupils equally round, reactive to light.  Extraocular muscles are intact.  FUNDUSCOPIC:  Benign.  OROPHARYNX:  Clear.  NECK:  Supple.  Full range of motion.  No thyromegaly, adenopathy or jugular  venous distention.  CARDIOVASCULAR:  Regular rate and rhythm.  LUNGS:  Clear to auscultation bilaterally.  ABDOMEN:  Positive bowel sounds, soft,  nontender, nondistended.  No  hepatosplenomegaly, no rebound, no guarding.  EXTREMITIES:  Without edema.  NEUROLOGIC EXAMINATION:  The patient is alert and oriented x3.  Cranial  nerves are intact and there are no focal deficits in motor or sensory  function.   LABORATORY STUDIES:  Reveal a white blood cell count of 16.9, hemoglobin  14.8, hematocrit 43.1, platelets 341, neutrophils 87%, sodium 137, potassium  3.7, chloride 106, CO2 24, BUN 11, creatinine 1, glucose 114.  Albumin 4,  AST 40, ALT 48, alkaline phosphatase 91, total bilirubin 0.7.  Urinalysis  negative.  KUB study performed revealing positive air fluid levels without  obstructed bowel gas pattern, no free air.   ASSESSMENT:  56. A 40 year old man with Crohn's disease being admitted with abdominal      pain and probable Crohn's disease flare up.  2. Leukocytosis secondary to stress reaction versus infection.   PLAN:  1. The patient will be admitted, made n.p.o. and administered IV fluids      for rehydration therapy.  2. Medications will be written for pain control and antiemetics p.r.n.  3. Patient  will also be placed on steroid therapy IV which will be      tapered.  4. Patient has been started on IV Cipro and Flagyl empically.      Della Goo, M.D.  Electronically Signed     HJ/MEDQ  D:  11/01/2005  T:  11/02/2005  Job:  [id number]

## 2010-06-14 NOTE — Assessment & Plan Note (Signed)
[facility]                           GASTROENTEROLOGY OFFICE NOTE   NAME:[name], [name]                     MRN:          [id number]  DATE:08/26/2005                            DOB:          1970-05-26   HISTORY:  Patient presents today for followup.  He was evaluated on July 24, 2005 after a recent hospitalization for partial small bowel obstruction due  to Crohn's disease.  He is felt to have both fixed stenotic disease as well  as superimposed inflammatory disease.  He is status post ileocectomy 19  years ago.   At the time of his last visit, he was on prednisone 40 mg daily.  He was  asked to continue the drug for an additional week and then decrease his  prednisone by 10 mg every three weeks.  As well, we had an extensive  discussion regarding the role of 6-mercaptopurine.  We did obtain TPMT  levels, which were normal.  He presents now for followup.   Since his last visit, he reports feeling well except for some transient  discomfort about two weeks ago.  No nausea or vomiting.  No fevers.  He said  he did extensive research on 6-mercaptopurine and is not willing to go on  the drug due to potential medication side effects.  I tried to give him a  balanced view in terms of risks and benefits profile, but despite this, he  continued to decline its use.  We also discussed biologic agents, including  Remicade and Humira.  He declined their use.  He did ask me about a number  of supplements, including creatine.  I told him I am not schooled in that  area and can make no recommendations.  His overall perspective if he  requires surgery once every 19 years, that this would not be so bad.  I  informed as to the possible consequences of short gut syndrome.   PHYSICAL EXAMINATION:  GENERAL:  A well-appearing male in no acute distress.  VITAL SIGNS:  Blood pressure 110/62, heart rate 88.  Weight is 290.2 pounds.  ABDOMEN:  Obese and soft with mild  tenderness in the right lower quadrant.  Good bowel sounds heard.  No rebound or mass.   IMPRESSION:  1.  Ileocrohn's disease with recent flare.  Again, I suspect both fixed      stenotic disease as well as superimposed inflammatory disease  2.  Status post ileocectomy 19 years ago.  3.  History of medical noncompliance.   RECOMMENDATIONS:  Patient will continue with his prednisone taper by 10 mg  every 3 weeks and then discontinue the drug.  For routine followup, I would  like to see him in six months  unless there are interval questions or problems.  Should he have increasing  frequency of flares and would not agree to medical therapy, then he indeed  may come to surgical resection.  Wilhemina Bonito. Eda Keys., MD  JNP/MedQ  DD:  08/26/2005  DT:  08/26/2005  Job #:  [id number]   cc:   Velora Heckler, MD

## 2010-06-14 NOTE — Assessment & Plan Note (Signed)
[facility]                         GASTROENTEROLOGY OFFICE NOTE   NAME:[name]                     MRN:          [id number]  DATE:05/05/2006                            DOB:          1970/12/04    HISTORY:  [name] presents today, overdue for followup.  He has a  history of ileal Crohn's disease and recent problems with recurrent  partial small bowel obstruction due to the same.  He has a combination  of fixed stenotic disease as well as superimposed inflammatory disease.  He is status post ileocecectomy about 19 years ago.  He also has a  history of medical noncompliance.  He was last evaluated in the office  on February 24, 2006.  At that time, he was on prednisone 30 mg daily and  200 mg daily.  We weaned his prednisone off over the next few weeks.  He continued on and was to follow up in one month.  He did not  follow up in one month and ran out of his .  He was out of the drug  for about two weeks, contacted the office pleading for a refill and  scheduled this follow-up appointment.  That refill was provided on April 10, 2006.  He has been on the medication since.   Overall, he states that he is doing well.  No obvious medication side  effects.  His abdomen is doing fine without significant pain or evidence  of recurrent partial obstruction.   His only prescription medication is 200 mg daily.   PHYSICAL EXAMINATION:  GENERAL:  A well-appearing male in no acute  distress.  VITAL SIGNS:  Blood pressure 128/72, heart rate 96.  Weight is 295.8  pounds.  HEENT:  Sclerae are anicteric.  Conjunctivae are pink.  LUNGS:  Clear.  HEART:  Regular.  ABDOMEN:  Obese and soft with mild tenderness to deep palpation in the  right lower quadrant.  Good bowel sounds heard.  No mass felt.   IMPRESSION:  1. Ileal Crohn's disease with recurrent partial small bowel      obstruction.  Seemingly well on (about 1.5 mg/kg).  2. Remote  history of ileocecectomy.  3. Recurrent medical noncompliance.   RECOMMENDATIONS:  1. CBC today and once monthly.  2. Continue without change.  3. Planned office visit in three months, assuming the patient      continues to do well.  If he has interval problems or questions, he      has been instructed to contact the office.     Wilhemina Bonito. Marina Goodell, MD  Electronically Signed    JNP/MedQ  DD: 05/05/2006  DT: 05/06/2006  Job #: [id number]   cc:   Jay Heckler, MD

## 2010-06-14 NOTE — H&P (Signed)
[name], [name]              ACCOUNT NO.:  [id number]   MEDICAL RECORD NO.:  [id number]          PATIENT TYPE:  EMS   LOCATION:  MAJO                         FACILITY:  [facility]   PHYSICIAN:  Hettie Holstein, D.O.    DATE OF BIRTH:  1971-01-07   DATE OF ADMISSION:  04/16/2004  DATE OF DISCHARGE:                                HISTORY & PHYSICAL   PRIMARY CARE PHYSICIAN:  The patient is unassigned.   CHIEF COMPLAINT:  Abdominal pain.   HISTORY OF PRESENT ILLNESS:  This is a pleasant 40 year old Caucasian male  who carries a history of Crohn's disease with his last flare being when he  was 40 years old, which was about 20 years ago, at which time he underwent  resection of his small and large bowel performed at the [facility]  in [place], who states that he has been asymptomatic since that time. He  takes no medications for his Crohn's and has had only one hospitalization  several years ago for about three days and has not really had any problem  since that time. He stated that this episode began last week, on Tuesday,  with abdominal pain. He developed some fevers and chills. Then he was okay  until Saturday and Sunday when he felt quite and on the morning of Sunday.  This persisted. He tried to alleviate his pain by not eating and drinking  only water. Today around 5 a.m. the pain occurred once again and  subsequently presented to the emergency department  for further evaluation.  He denies any recent unintentional weight loss. He has been in his usual  state of health. He has no extra intestinal manifestations of Crohn's by  history. He has no primary care physician in the community and was planning  on setting up a new patient visit to a physician in [place]; his mother is  going to providing that name and number tomorrow so that a continuity can be  established.   PAST MEDICAL HISTORY:  As noted above and significant for Crohn's disease  with the last flare being  at age 53. He has recently diagnosed obstructive  sleep apnea and was just placed on CPAP two weeks ago by an ear, nose, and  throat doctor in [place]. He is tobacco dependent.   PAST SURGICAL HISTORY:  By Dr. P__________ at [facility]  at the age of 7 had a resection of what his mother describes as part of his  small and large bowel and lysis of adhesions at that time. Otherwise, he has  had no other surgeries.   MEDICATIONS:  He takes no prescription medications. He takes some  supplements including muscle milk pro complex as well as [employer] and  ephedrine. He states he has been taking ephedrine because he has been having  difficulty staying awake at times during the day.   FAMILY HISTORY:  His mother is alive and well at age 43. He has one sister  who has Crohn's disease. His parents are separated. His father is alive at  age 39 and he is not certain  of his health history.   ALLERGIES:  No known drug allergies.   REVIEW OF SYSTEMS:  He has had no nausea, vomiting, diarrhea out of his  baseline. He has had no chest pain or shortness of breath. He does have  abdominal pain as described above. No dysuria. Swelling of his lower  extremities. No oral lesions. No headaches or photophobia. Further review of  systems is unremarkable. He denies unintentional weight loss.   PHYSICAL EXAMINATION:  VITAL SIGNS: Blood pressure 131/62, heart rate 80,  respirations 15, O2 saturation on room air 96%.  GENERAL: The patient is alert, oriented, and in no acute distress. He is  answering questions appropriately.  NECK: Supple and nontender. No palpable thyromegaly or mass.  CHEST: Clear to auscultation bilaterally with normal effort and no dullness  to percussion.  CARDIOVASCULAR: Normal S1 and S2 without S3 or S4 or murmur.  ABDOMEN: Diffusely tender with mild rebound. There is no costovertebral  angle tenderness. There is suprapubic tenderness and right lower quadrant   tenderness to palpation. Bowel sounds are normoactive.   Laboratory data available in the emergency department as follows: Hemoglobin  of 14, WBC 14.6, MCV 85, platelet count 416,000, creatinine 1.1. Urinalysis  reveals positive ketones.   CT revealed terminal ileum thickening comparable with Crohn's and a small  amount of free fluid in the pelvis and perifat stranding noted on the  imaging scan also.   OBJECTIVE ASSESSMENT:  1.  Crohn's history with terminal ileitis radiographically and clinically.  2.  Tobacco dependence.  3.  Obstructive sleep apnea.  4.  Obesity.   PLAN:  At this time we are going to admit Mr. [name] for 23-hour  observation. Will keep him NPO for now, except clear liquids and administer  IV fluids. Initiate Anticort and establish outpatient follow-up visits so  that response can be assessed in an outpatient setting. It is anticipated he  may be able to go home within the next 24 to 48 hours if his symptoms  subside. In the meantime we will administer narcotic analgesics to assist in  pain management.      ESS/MEDQ  D:  04/16/2004  T:  04/16/2004  Job:  [id number]

## 2010-06-14 NOTE — Assessment & Plan Note (Signed)
 HEALTHCARE                         GASTROENTEROLOGY OFFICE NOTE   NAME:[name], [name]                     MRN:          [id number]  DATE:05/22/2006                            DOB:          [birth year]    HISTORY:  [name] presents today for followup. He was seen in the office  May 05, 2006 for ongoing management of his ileal Crohn's disease. He  has had problems with recurrent partial small bowel obstruction felt to  be secondary to a combination of fixed stenotic disease and inflammatory  disease. He is maintained on 6-MP 200 mg daily (approximately 1.5  mg/kg). CBC at the time of his last office visit was unremarkable. He  was to have CBCs once monthly and return in 3 months. However, on April  14th he contacted the office with complaints of several days of  abdominal pain. He was referred to the emergency room. Blood work that  day was unremarkable. He was seen in consultation by my colleague, Dr.  Leone Payor. CT enterography was obtained and revealed evidence of active  Crohn's disease in the terminal ileum with low-grade obstruction. He was  started on 40 mg of prednisone, asked to continue his 6-MP and followup  in the office with me today. Since that time, the patient has done well.  His diet is back to normal. No nausea or vomiting. Still some moderate  intermittent discomfort though he states this is quite tolerable. No  appreciable medication side effects or other symptoms.   PHYSICAL EXAMINATION:  GENERAL:  Well-appearing male in no acute  distress.  VITAL SIGNS:  Blood pressure is 112/72, heart rate is 64, weight is  293.2 pounds.  ABDOMEN:  Soft with tenderness to palpation in the right lower quadrant.  No guarding or rebound. Good bowel sounds heard. No hernia.   IMPRESSION:  1. Ileal Crohn's disease with recurrent partial small bowel      obstruction due to a combination of fixed stenotic disease and      inflammatory disease.  2.  Status post ileocecectomy remotely.  3. History of medical noncompliance.   RECOMMENDATIONS:  1. Continue 6-MP 200mg  per day without change.  2. Continue prednisone 40 mg for a total of 2 weeks and then decreased      prednisone by 10 mg every 2 weeks until off.  3. Continue once monthly CBCs.  4. Office followup in 6 weeks. He knows to contact the office in the      interim for any questions or problems.     Wilhemina Bonito. Marina Goodell, MD  Electronically Signed    JNP/MedQ  DD: 05/22/2006  DT: 05/22/2006  Job #: [id number]   cc:   Velora Heckler, MD

## 2010-06-14 NOTE — Discharge Summary (Signed)
NAME:  [name], [name]              ACCOUNT NO.:  [id number]   MEDICAL RECORD NO.:  [id number]          PATIENT TYPE:  INP   LOCATION:  5702                         FACILITY:  [facility]   PHYSICIAN:  Hind I Elsaid, MD      DATE OF BIRTH:  [birth year]   DATE OF ADMISSION:  10/31/2005  DATE OF DISCHARGE:  11/04/2005                                 DISCHARGE SUMMARY   DISCHARGE DIAGNOSIS:  Partial small-bowel obstruction and Crohn's disease.   CLINICAL HISTORY:  Patient was admitted on October 31, 2005, with lower  abdominal pain, not radiating.  Not associated was noted or vomiting.  Abdominal x-ray was done on October 6th, showed nonspecific bowel gas  pattern, scattered air-fluid level within the mid right abdomen without  evidence of obstruction.  A small-bowel follow-through was done, which  showed small-bowel follow-through to segment of marked narrowing of the  distal small bowel as described.  There is associated minimum dilation of  multiple proximal and mid small bowel loops with normal passage of contract  through the next segment into the colon.  The narrow segment could be due to  scarring, active inflammation or a combination of the two.  Patient was  diagnosed with partial small bowel obstruction secondary to Crohn's with  stenosis, and patient is at risk in the future for surgical intervention.  Dr. Allyson Sabal, GI, was consulted, patient's gastroenterologist as outpatient.  He recommended to taper Solu-Medrol 60 mg IV q.12h. and to gradually advance  diet to clear liquid diet.  They recommended patient to start methotrexate  to decrease the risk of surgical intervention in the future.   CONDITION ON DISCHARGE:  Pain completed resolved.  Temperature 98.6, pulse  rate 67, blood pressure 113/69, respiratory rate 20, and oxygenation 97% on  room air.  Examination was completely benign.   LABORATORIES:  White blood cells 94, hemoglobin 14.3, hematocrit 41.5, and  platelets 259.  Sodium  138, potassium 3.9, chloride 105, and bicarb 28, BUN  12, creatinine 0.8, and glucose 88.   Disposition and further plan   On discharge, patient was given an appointment with Dr. Betti Cruz for October  25th.  Patient to be discharged on prednisone 60 mg a day for 2 weeks, then  taper to 40 mg a day, and to start 6-mercaptopurine 200 mg a day, and  multivitamin.  Recommended to continue a diet with low residue.      Hind Bosie Helper, MD  Electronically Signed     HIE/MEDQ  D:  11/04/2005  T:  11/05/2005  Job:  [id number]

## 2010-06-14 NOTE — Discharge Summary (Signed)
[name], SOUTHGATE              ACCOUNT NO.:  [id number]   MEDICAL RECORD NO.:  [id number]          PATIENT TYPE:  INP   LOCATION:  1341                         FACILITY:  [facility]   PHYSICIAN:  Rachael Fee, MD   DATE OF BIRTH:  [birth year]   DATE OF ADMISSION:  10/12/2006  DATE OF DISCHARGE:  10/16/2006                               DISCHARGE SUMMARY   ADMISSION DIAGNOSES:  26. A 40 year old male with known Crohn's ileitis quiescent with 6-MP      who is status post a remote ileocecectomy approximately 19 years      ago and then subsequent anastomotic stricture with history of      recurrent small bowel obstructions.  He is status post lap-assisted      lysis of adhesions and ileocolonic resection September 18, 2006, and      now presenting with partial small bowel obstruction and question of      an interloop abscess.  2. Obesity.  3. Sleep apnea.   DISCHARGE DIAGNOSES:  1. Resolving partial bowel obstruction secondary to inflammatory      changes at the ileocolonic anastomosis, persistent fluid      collection, which it appears to be, in the bowel wall of distal      loop of ileum, question intramural hematoma.  No rim enhancement to      suggest abscess, though cannot totally rule out.  2. Other diagnoses as listed above.   CONSULTATIONS:  Dr. Michaell Cowing, surgery.   PROCEDURES:  CT of the abdomen and pelvis x2.   BRIEF HISTORY:  [name] is a pleasant 40 year old white male known to  Dr. Marina Goodell with history of Crohn's ileitis.  He is status post remote  ileal cecectomy 19 years previous and then developed a stricture at the  anastomosis and had been in with recurrent small bowel obstruction.  He  had a lap assisted lysis of adhesions on September 18, 2006 per Dr. Michaell Cowing  and also a lap assisted ileocolonic resection and anastomosis.  He  resumed to 6-MP postoperatively at 200 mg per day.  He was not felt to  have any active Crohn's at the time of surgery.  The patient related he  had been doing well with no problems until the evening prior to  admission when he developed rather acute mid abdominal pain which  gradually progressed, was associated with nausea and vomiting.  He says  after he vomited, he felt better briefly, and then his symptoms recurred  and he developed increased abdominal distention, bloating and came to  the emergency room.  He had been having normal bowel movements until the  day prior to admission and has not had a bowel movement or flatus since  that time, no fever or chills.  He was seen and admitted through the  emergency room for pain control and further diagnostic evaluation.  At  the time of admission CT showed wall thickening and edema at the  anastomosis post right hemicolectomy.  He also had a 4.4 x 2.7 cm low  density fluid collection along the loop of small bowel, question  within  the bowel wall which could represent a heme and abscess.   ADMISSION LABORATORY DATA:  WBC was 13.8, hemoglobin 14.3, hematocrit  40.9, MCV of 89, platelets 377.  Followup on the 18th, WBC of 5.5,  hemoglobin 12.7, hematocrit of 35.90, MCV of 89.  Electrolytes on  admission showed potassium 3.3, BUN 10, creatinine 0.94, GFR greater  than 60.   X-ray studies as above.  Plain abdominal films on the 16th showed small  bowel distention again appreciated without progression and a followup CT  on the 19th showed stable postoperative changes with an ileocolonic  anastomosis,  mild inflation inflammation of the distal ileal loops of  small bowel improved since the prior CT.  Contrast does get all the way  to the colon so no significant obstruction, had a persistent fluid  collection associated with a distal small bowel loop, question  intramural hematoma, intramural abscess possible,  but no definite room  enhancement and a negative pelvic CT.   HOSPITAL COURSE:  The patient was admitted to the service of Dr. Wendall Papa.  He was initially kept NPO.  The  patient did not want an NG tube  and we held off on this.  He was placed on IV fluids, given Dilaudid for  pain, Zofran for nausea, covered with IV Cipro and Flagyl.  Plain films  in the a.m. did show some mild improvement.  He was also seen in  consultation by surgery who agreed with the conservative management and  followup CT scan.  In the interim he gradually improved by the 17th.  He  had had a bowel movement.  Was having less abdominal pain, no further  vomiting.  His diet was gradually advanced and he was scheduled for  followup CT scan of the abdomen and pelvis on the 19th with findings as  outlined above.  This did show improvement but did still showed the  fluid collection which was felt possibly a hematoma.  He was afebrile  and tolerating solid food and was allowed discharge to home on the 19th  with instructions to follow up with Dr. Marina Goodell on Wednesday, October 15  at 10:30 a.m.; to call for any problems in the interim.   DISCHARGE MEDICATIONS:  1. He was to restart his 6-MP at 200 mg daily on October 1.  2. He was to use Percocet 5/325 one every 4 hours as needed for pain.  3. Cipro 500 mg b.i.d. to finish a 10-day course.  4. Flagyl 500 mg three times daily, again to finish a 10-day course.  5. Multivitamin daily.  6. Calcium supplement daily.  7. Omega three daily as previous.      Mike Gip, PA-C      Rachael Fee, MD  Electronically Signed    AE/MEDQ  D:  10/26/2006  T:  10/27/2006  Job:  [id number]   cc:   Ardeth Sportsman, MD  [address] [place] [address]   Wilhemina Bonito. Marina Goodell, MD  [address]  [place]  [place] [address]

## 2010-06-14 NOTE — Discharge Summary (Signed)
NAMEMADDOXX, BURKITT              ACCOUNT NO.:  [id number]   MEDICAL RECORD NO.:  [id number]          PATIENT TYPE:  INP   LOCATION:  5704                         FACILITY:  [facility]   PHYSICIAN:  Currie Paris, M.D.DATE OF BIRTH:  Apr 17, 1970   DATE OF ADMISSION:  07/06/2005  DATE OF DISCHARGE:  07/09/2005                                 DISCHARGE SUMMARY   FINAL DIAGNOSES:  1.  Partial small bowel obstruction.  2.  Crohn disease.   CLINICAL HISTORY:  The patient was admitted on June 10 with 24 hours of  lower abdominal pain, nausea, vomiting.  An abdominal x-ray showed some air-  fluid levels and dilated loops of small bowel.  He had a white count of  19,000.  Abdominal exam showed no abdominal tenderness.   HOSPITAL COURSE:  The patient was admitted with a diagnosis of Crohn disease  and a small bowel flare. He was treated with Solu-Medrol and Pentasa.  His  abdominal symptoms improved fairly dramatically and was able to start a diet  and advance to solids.  By June 13 he was able to be discharged.  He is to  be followed post discharge by the GI service since his primary issue appears  to be his Crohn disease.  He was sent home on a low roughage diet to be  followed up by Dr. Marina Goodell on Thursday, June 28.   Laboratory studies this admission included an initial white count of 19,000,  hemoglobin of 15, and a followup of 13,000 and hemoglobin of 14.  PT and PTT  were unremarkable.  Albumin was 4.  Liver functions were normal except for a  slight elevation of ALT at 57 with a repeat of 46.  Urinalysis was negative.      Currie Paris, M.D.  Electronically Signed     CJS/MEDQ  D:  07/31/2005  T:  07/31/2005  Job:  [id number]   cc:   Wilhemina Bonito. Marina Goodell, M.D. [facility]  [address]  [place]  [place] [address]

## 2010-06-14 NOTE — Consult Note (Signed)
[name], [name]              ACCOUNT NO.:  [id number]   MEDICAL RECORD NO.:  [id number]          PATIENT TYPE:  INP   LOCATION:  5704                         FACILITY:  [facility]   PHYSICIAN:  Jordan Hawks. Elnoria Howard, MD    DATE OF BIRTH:  Aug 27, 1970   DATE OF CONSULTATION:  07/06/2005  DATE OF DISCHARGE:                                   CONSULTATION   REASON FOR CONSULTATION:  Crohn's flare and obstruction secondary to  Crohn's.   REFERRING PHYSICIAN:  Dr. Darnell Level   GASTROENTEROLOGIST:  Dr. Yancey Flemings   HISTORY OF PRESENT ILLNESS:  This is a 40 year old white male with a past  medical history of Crohn's disease status post terminal ileum resection at  the age of 40 who presents with complaints of progressively worsening  abdominal pain.  The patient states that after his initial surgery when he  was 40 he did not have any further symptoms at that time until approximately  three years ago.  He subsequently developed some types of abdominal pain  that required hospital evaluation.  His most recent hospital evaluation was  in March 2006 and at that time he was admitted to the hospital and Dr. Yancey Flemings evaluated patient and treated him with steroids.  The patient promptly  improved and he also underwent a colonoscopy which revealed a long  anastomotic stricture that was not amenable to dilation.  However, the  patient did improve with conservative management and subsequently was able  to be discharged home.  The patient states that he has one exacerbation per  year and at this time he reports that his pain started one day prior to  admission.  Initially, he felt to be mild cramping and it was tolerable at  that time.  However, as the day progressed, the pain worsened, it became  intolerable which led him to be present to the emergency room.  While in the  emergency room abdominal x-rays were obtained, it was noted he had multiple  air fluid levels and he was admitted onto the  surgical service and an NG  tube was placed in the patient.  Patient denies any fevers but he does  report having nausea and vomiting.   PAST MEDICAL HISTORY/PAST SURGICAL HISTORY:  As stated above with the  addition of sleep apnea requiring the use of a CPAP.   MEDICATIONS:  None.   ALLERGIES:  No known drug allergies.   SOCIAL HISTORY:  Patient works for a [employer].  Quit smoking four  months ago and occasional alcohol use.   FAMILY HISTORY:  Noncontributory.   REVIEW OF SYSTEMS:  Negative for any dizziness, blurry vision, headaches,  arthralgias, dysarthria, chest pain, shortness of breath, fevers, chills,  diarrhea, constipation, hematochezia, hematemesis, skin rashes, or any new  neurologic changes.  Generally the patient is in no acute distress with an  NG tube in place.  There is some tenderness with palpation in the right  lower quadrant.  Positive bowel sounds.  HEENT:  Normocephalic, atraumatic.  Extraocular movements intact.  Pupils equal and round, reactive to light.  Neck is supple.  No lymphadenopathy.  Lungs are clear to auscultation  bilaterally.  Cardiovascular:  Regular rate, rhythm without murmurs, rubs,  or gallops.  Abdomen is flat, soft, nontender, nondistended.  Extremities:  No clubbing, cyanosis, edema.  Vital signs:  Blood pressure is 124/75, heart  rate is 85, temperature is 98.1, respirations 18.   LABORATORY VALUES:  White blood cell count is 19, hemoglobin 15.3, MCV is  85.6, platelets at 358.  PT is 12.8, INR 0.9, PTT 21.  Sodium is 137,  potassium 3.8, chloride is 105, CO2 24, glucose 128, BUN is 10, creatinine  0.8, total bilirubin 0.7, alkaline phosphatase 112, AST is 32, ALT 57,  albumin is 4.  Urinalysis is negative.   IMPRESSION:  1.  Crohn's disease flare.  2.  History of an anastomotic stricture.   At this time the patient is more comfortable after having an NG tube placed.  The CT scan was performed and I have reviewed it with  the radiologist.  As  compared to his prior CT scan in 2006 it is not as severe; however, there is  definitely swelling, edema, and thickening of his terminal ileum portion  approximately 20 cm in length.  There is no evidence of any perforation or  abscess.  The patient responded quite well with the prior treatment and he  should respond to steroids at this time.   PLAN:  1.  Start on Solu-Medrol 60 mg q.12h.  The patient does weigh 280 pounds.  2.  Pentasa 1 g q.i.d. once the NG tube is removed.  3.  Check ESR.      Jordan Hawks Elnoria Howard, MD  Electronically Signed     PDH/MEDQ  D:  07/06/2005  T:  07/07/2005  Job:  [id number]   cc:   Wilhemina Bonito. Marina Goodell, M.D. [facility]  [address]  [place]  [place] [address]

## 2010-06-14 NOTE — Assessment & Plan Note (Signed)
[facility]                         GASTROENTEROLOGY OFFICE NOTE   NAME:[name]                     MRN:          [id number]  DATE:02/24/2006                            DOB:          1971/01/09    HISTORY:  [name] presents today much overdue for followup.  He has a  history of ileal Crohn's disease and recent problems with recurrent  partial small bowel obstruction likely due to a combination of fixed  stenotic disease and superimposed inflammatory disease.  He is status  post ileocecectomy 19 years ago.  He has a history of medical  noncompliance.  At the time of his most recent hospitalization in early  October 2007, I came to see him and we had a long discussion about  medical treatment options versus surgery.  The latter seemed inevitable,  though he was interested in pursuing medical treatment.  We discussed 6-  MP and biological therapy in great detail.  We discussed the action of  the drugs, their potential benefits, potential risks, some of which are  serious, and the patient's responsibility in terms of compliance,  followup, and regular blood work. He understood and agreed to initiating  6-MP.  He was sent home on 60 mg of prednisone daily as well as 200 mg  of 6-MP.  This  dose was based on his body weight and normal TPMT  genetics.  Despite this, within a few weeks, the patient discontinued  all of his medications (stating that he was feeling better). Thereafter,  he began to have problems with recurrent abdominal pain.  He did not  contact medical personnel.  He decided to resume his prednisone at 30 mg  daily and 6-MP at 200 mg daily.  He tells me that he has been compliant  on these medications at the stated dosages without change for 2+ months.  He decides to present today for followup, it appears, because his  prescriptions are about to expire.  Overall, he states he is feeling  better.  He has no abdominal pain.  He states  his bowel habits are  somewhat loose but no bleeding.  No nausea or vomiting.  He states he is  on some special diet and is exercising.  It is not clear that he has had  any side effect from his medications.  He has not had followup blood  work.   CURRENT MEDICATIONS:  Listed as:  1. Multivitamin.  2. Prednisone 30 mg daily.  3. Creatine.  4. Green Tea extract.  5. Protein.  6. Caffeine supplement.  7. Thermogenics.  8. Omega-3 fish oil.  9. Vitamin D and calcium.  10.Testosterone supplement, herbal form.  11.6-MP 200 mg daily.   PHYSICAL EXAM:  A well-appearing male in no acute distress.  Blood pressure 150/70.  Heart rate 88. Weight is 296.4 pounds.  HEENT:  Sclerae are anicteric.  Oral mucosa intact.  There is no thrush.  LUNGS:  Clear.  HEART:  Regular.  ABDOMEN:  Obese and soft without tenderness, mass, or hernia.  Previous  surgical incision is well healed.  EXTREMITIES:  Without edema.  IMPRESSION:  Ileal Crohn's disease with recurrent partial small bowel  obstruction likely due to a combination of fixed stenotic disease as  well as superimposed inflammatory disease.  Problems with significant  medical noncompliance as discussed above.  Status post ileocecectomy 19  years ago, seems to be doing well clinically on 6-MP and prednisone.   RECOMMENDATIONS:  I read the patient the riot act regarding his  ongoing issues with medical noncompliance.  If he continues to exhibit  noncompliance in any fashion, then he will likely be discharged from  this practice at which point he will need to seek healthcare elsewhere.  He understood and is agreeable at being compliant.  As such, I have  asked him to taper his prednisone by 10 mg per week over the next two  week such that he is off the drug in 2 weeks.  He will start with 20 mg  daily tomorrow.  He should continue his 6-MP 200 mg daily.  We will  obtain blood work today including a CBC and comprehensive metabolic  panel.  He  will only have a 68-month prescription of drug without  refills, as he is asked to return to the office in 4 weeks.  If he is  able to exhibit ongoing compliance, then we will renew his medications  according.  As always, he has been instructed to contact the office for  any questions or problems.     [name]. Jay Goodell, MD  Electronically Signed    JNP/MedQ  DD: 02/24/2006  DT: 02/24/2006  Job #: [id number]   cc:   Jay Heckler, MD

## 2010-06-14 NOTE — H&P (Signed)
NAMEPRAVIN, PEREZPEREZ              ACCOUNT NO.:  [id number]   MEDICAL RECORD NO.:  [id number]          PATIENT TYPE:  OBV   LOCATION:  1504                         FACILITY:  [facility]   PHYSICIAN:  Iva Boop, MD,FACGDATE OF BIRTH:  October 30, 1970   DATE OF ADMISSION:  05/11/2006  DATE OF DISCHARGE:                              HISTORY & PHYSICAL   CHIEF COMPLAINT:  Abdominal pain and bloating.   HISTORY:  Porter is a pleasant 40 year old white male known to Dr.  Marina Goodell with history of ileal Crohn's disease.  He is status post remote  ileocecectomy done approximately 19 years ago and has had problems with  recurrent partial small bowel obstruction.  He is felt to have a  combination of fixed stenotic disease and superimposed inflammatory  disease.  He also has history of noncompliance with medications.  His  last course of prednisone was started in January of 2008 and was tapered  off by mid March.  He, around that time, also ran out of 6-MP and was  off for about 2 weeks.  He had seen Dr. Marina Goodell back in the office and was  placed back on the 6-MP and was seen on April 8th doing well.  He had  onset on May 08, 2006 with right mid-quadrant abdominal pain, which he  says feels like his Crohn's pain and obstructive pain.  This has  persisted over the past four days, has been intermittent and somewhat  wavelike.  He has had no fever or chills.  No nausea or vomiting.  He  has been having loose stools.  No melena or hematochezia.  He put  himself on a liquid diet over the weekend and then called the office on  April 14th and was asked to come to the emergency room for evaluation  and x-rays.   CURRENT MEDICATIONS:  1. 6-MP 200 mg per day.  2. Various supplements including a thermogenic supplement, a caffeine      supplement, which he takes variously.   ALLERGIES:  No known drug allergies.   PAST MEDICAL HISTORY:  Benign, except Crohn's disease, as above.   SOCIAL HISTORY:  The  patient works at a [employer].  He is single.  No children.  He smokes about 1-1/2 packs per week.  No ETOH.   FAMILY HISTORY:  Negative for GI.   REVIEW OF SYSTEMS:  CARDIOVASCULAR:  Negative for chest pain or anginal  symptoms.  PULMONARY:  Negative for cough, shortness of breath or sputum  production.  GENITOURINARY:  Negative for dysuria, urgency or frequency.  MUSCULOSKELETAL:  Negative for myalgias or arthralgias.  GI:  As  outlined above.  SKIN:  Negative.  NEURO:  No complaints.   PHYSICAL EXAMINATION:  Well-developed white male in no acute distress.  Temp is 99, blood pressure 135/75, pulse is 112, sat is 96.  HEENT:  Atraumatic normocephalic.  EOMI.  PERRLA.  Sclerae anicteric.  CARDIOVASCULAR:  Regular rate and rhythm with S1 and S2.  PULMONARY:  Clear to A&P.  ABDOMEN:  Soft.  He is tender in the right mid-quadrant.  There is  no  guarding and no rebound.  He does have a midline scar.  RECTAL:  Exam is not done at this time.  EXTREMITIES:  Without clubbing, cyanosis or edema.  NEURO:  Grossly nonfocal.  SKIN:  Benign.   LABORATORY DATA:  WBC of 12.2, hemoglobin 14.2, hematocrit of 31.8, MCV  of 86.  Urinalysis was negative.  Electrolytes within normal limits.   IMPRESSION:  4. A 40 year old white male with known Crohn's ileal disease with      stricture at the anastomosis and one other segment of narrowing in      the distal small bowel, now with abdominal pain.  Rule out      recurrent obstruction secondary to active Crohn's versus      cicatrization.  2. Status post ileocecectomy 19 years ago.  3. History of medical noncompliance.   PLAN:  The patient is admitted to the service of Dr. Stan Head for  observation.  He will be placed at bowel rest.  We will continue his 6-  MP.  He will be given antiemetics and narcotics as needed for pain.  We  will proceed with CT enterography and then decide on best management.  For details, please see the orders.       Amy Esterwood, PA-C      Iva Boop, MD,FACG  Electronically Signed    AE/MEDQ  D:  05/12/2006  T:  05/12/2006  Job:  [id number]

## 2010-06-14 NOTE — H&P (Signed)
[name], [name]              ACCOUNT NO.:  [id number]   MEDICAL RECORD NO.:  [id number]          PATIENT TYPE:  INP   LOCATION:  1823                         FACILITY:  [facility]   PHYSICIAN:  Velora Heckler, MD      DATE OF BIRTH:  03-13-1970   DATE OF ADMISSION:  07/06/2005  DATE OF DISCHARGE:                                HISTORY & PHYSICAL   CHIEF COMPLAINT:  Abdominal pain, nausea, vomiting, history of Crohn's  disease.   HISTORY OF PRESENT ILLNESS:  The patient is a 40 year old white male who  presents to the emergency department with a 24 hour history of lower  abdominal pain.  This was associated with nausea and vomiting.  The patient  denies diarrhea.  He denies hematemesis.  He denies fever or chills.  The  patient was evaluated in the emergency department.  Abdominal x-ray  demonstrated air fluid levels and dilated loops of small bowel consistent  with small bowel obstruction.  Laboratory studies showed an elevated white  count of 19,000, but otherwise normal.  General surgery was consulted for  management.   PAST MEDICAL HISTORY:  1.  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  2.  Status post nasal surgery for sleep apnea in [place] one year ago.  3.  History of sleep apnea requiring CPAP.   MEDICATIONS:  None.   ALLERGIES:  No known drug allergies.   SOCIAL HISTORY:  The patient is accompanied by a girlfriend.  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  He quit smoking four months ago.  He  drinks alcohol on occasion.  He is single without children.   FAMILY HISTORY:  Noncontributory.   REVIEW OF SYSTEMS:  A 15 system review otherwise negative.   PHYSICAL EXAMINATION:  GENERAL:  A 40 year old mildly obese white male on a  stretcher in the emergency department.  In no acute distress.  VITAL SIGNS:  Temperature 97.7, pulse 82, respirations 20, blood pressure  140/77.  HEENT:   Normocephalic, atraumatic.  Sclerae clear.  Conjunctivae clear.  Pupils equal and reactive.  Dentition good.  Mucous membranes dry.  There is  a nasogastric tube in the nose.  NECK:  Supple, nontender, thyroid normal without nodularity.  There is no  lymphadenopathy.  LUNGS:  Clear to auscultation bilaterally without wheezes, rhonchi, or  rales.  CARDIAC:  Regular rate and rhythm without murmur.  Peripheral pulses are  full.  EXTREMITIES:  Nontender with trace edema.  ABDOMEN:  Soft.  There is a large transverse abdominal incision which is  well-healed.  There are no bowel sounds on auscultation.  There is mild  tenderness in the right lower quadrant and right mid abdomen.  There is no  mass.  There is no guarding.  NEUROLOGIC:  The patient is alert and oriented without focal deficit.   LABORATORY DATA:  White count 19, hemoglobin 15.3, hematocrit 43.4%,  platelet count 358,000.  Differential shows 91% neutrophils, 6% lymphocytes,  3% monocytes.  No other laboratory studies  were obtained by the emergency  department physician.   Radiographic studies:  Flat and upright abdominal x-rays showing air fluid  levels and mildly dilated small bowel consistent with partial small bowel  obstruction.   IMPRESSION:  1.  Probable partial small bowel obstruction.  2.  History of Chron's disease, probable flare.  3.  History of sleep apnea.   PLAN:  1.  Admission to [facility] on surgical service.  2.  Consult the patient's gastroenterologist, Dr. Yancey Flemings, for assistance      in management.  3.  CT scan of abdomen and pelvis today to evaluate for possible Chron's      flare.  4.  N.p.o. with intravenous hydration and nasogastric decompression.  5.  Respiratory consult for sleep apnea.      Velora Heckler, MD  Electronically Signed     TMG/MEDQ  D:  07/06/2005  T:  07/06/2005  Job:  [id number]   cc:   Hassan Buckler. Weldon Inches, MD  Fax: [phone]   Wilhemina Bonito. Marina Goodell, M.D. [facility]  [address]  [place]  [place] [address]

## 2010-06-14 NOTE — Discharge Summary (Signed)
[name], [name]              ACCOUNT NO.:  [id number]   MEDICAL RECORD NO.:  [id number]          PATIENT TYPE:  INP   LOCATION:  5703                         FACILITY:  [facility]   PHYSICIAN:  Gertha Calkin, M.D.DATE OF BIRTH:  07-Jan-1971   DATE OF ADMISSION:  04/16/2004  DATE OF DISCHARGE:                                 DISCHARGE SUMMARY   CONSULTATIONS:  Dr. Marina Goodell, Gastroenterology.   DISCHARGE DIAGNOSES:  1.  Acute Crohn's disease flareup.  2.  Obstructive sleep apnea.  3.  Tobacco abuse.  4.  Obesity.   DISCHARGE MEDICATIONS:  1.  Prednisone 40 mg p.o. daily.  2.  Darvocet-N 100 1-2 tablets q.4h p.r.n.  3.  Protonix 40 mg daily.   DIAGNOSTIC STUDIES:  1.  CT of abdomen with contrast done April 16, 2004.  Impression shows small      free fluid, mildly enlarged right lower quadrant, mesentery lymph nodes.      There was also marked thickening of the terminal ileum and a small      amount of free fluid.  2.  Colonoscopy performed April 19, 2004.  Findings show a stricture in the      anastomosis site.  There was also partial constriction.  Comments under      the colonoscopy report indicate that there is a long segment that was      not amenable to dilation and no obvious active inflammation present.  A      pediatric scope would not pass proximally, however biopsy forceps did      pass through the stricture and multiple nondirective biopsies were      obtained.  The lumen diameter of that area was approximately 5 cm.   HOSPITAL COURSE:  Please see H&P for details of the admission.  1.  Acute Crohn's disease flareup.  The patient was initially admitted,      started on IV antibiotics with bowel rest.  GI was consulted.  Please      refer to their notes for details.  The patient was prepped for      colonoscopy and colonoscopy report as indicated above.  After procedure,      the patient is tolerating p.o. diet and was cleared by GI to follow up      on outpatient  basis with p.o. steroids to be taken at discharge.  Pain      medications were also prescribed as noted in discharge medication list.      This was discussed with the patient who is comfortable and has been      given a work excuse to perform only light duty for 2 weeks or until he      sees Dr. Marina Goodell in outpatient hospital followup.  That appointment has      already been set for April 5 which is a Wednesday at 2:30 p.m.  He is      being sent home after his dose of prednisone today and follow up as      indicated.  2.  Obstructive sleep apnea.  Continue his outpatient C-PAP.  3.  Tobacco abuse.  Counseled him on importance of cessation in regards to a      trigger for further Crohn's flare ups.  The patient understands and      states that he has tried the patch but has not tried the gum in the      past.  He is also interested in antidepressant therapy with Wellbutrin,      however at this time we will discharge him home to attempt Nicorette      gum.   DISPOSITION:  Discharge labs:  The patient is stable, tolerating a p.o.  diet.  His white count is 7.7, hemoglobin 14.2, platelets 226.  His BMET  metabolic profile was all within normal limits except his glucose which was  increased most like due to his steroids.  At this time he is being  discharged home with followup appointments with Dr. Marina Goodell and he has stated  he will attempt to establish himself with a primary care physician at  discharge.      JD/MEDQ  D:  04/19/2004  T:  04/20/2004  Job:  [id number]   cc:   Wilhemina Bonito. Marina Goodell, M.D. [facility]

## 2010-11-07 LAB — BASIC METABOLIC PANEL
CO2: 23
Chloride: 107
Creatinine, Ser: 0.94
GFR calc Af Amer: >60
GFR calc non Af Amer: >60
GFR calc non Af Amer: >60
Glucose, Bld: 88
Potassium: 3.3 — ABNORMAL LOW
Potassium: 3.4 — ABNORMAL LOW
Sodium: 138

## 2010-11-07 LAB — DIFFERENTIAL
Basophils Absolute: 0
Basophils Absolute: 0
Basophils Relative: 0
Eosinophils Absolute: 0.4
Eosinophils Relative: 3
Eosinophils Relative: 7 — ABNORMAL HIGH
Lymphs Abs: 1.4
Lymphs Abs: 2
Monocytes Absolute: 0.8 — ABNORMAL HIGH
Monocytes Relative: 7
Neutro Abs: 11.1 — ABNORMAL HIGH

## 2010-11-07 LAB — CBC
HCT: 35.9 — ABNORMAL LOW
HCT: 40.9
Hemoglobin: 11.9 — ABNORMAL LOW
Hemoglobin: 12.7 — ABNORMAL LOW
MCV: 89.2
Platelets: 260
Platelets: 377
RBC: 4.03 — ABNORMAL LOW
RBC: 4.55
WBC: 13.8 — ABNORMAL HIGH
WBC: 5.5

## 2010-11-08 LAB — COMPREHENSIVE METABOLIC PANEL
BUN: 10
CO2: 27
Calcium: 9.2
GFR calc non Af Amer: >60
Glucose, Bld: 105 — ABNORMAL HIGH
Total Protein: 7.2

## 2010-11-08 LAB — CBC
HCT: 36.9 — ABNORMAL LOW
HCT: 41.8
Hemoglobin: 12.9 — ABNORMAL LOW
Hemoglobin: 14.6
MCHC: 34.9
MCHC: 34.9
MCV: 90
MCV: 90.4
MCV: 90.8
Platelets: 329
RBC: 4.06 — ABNORMAL LOW
RBC: 4.39
RBC: 4.65
RDW: 13.5
RDW: 13.6
WBC: 14.3 — ABNORMAL HIGH

## 2010-11-08 LAB — DIFFERENTIAL
Basophils Relative: 0
Eosinophils Absolute: 0
Eosinophils Absolute: 0.1
Lymphocytes Relative: 5 — ABNORMAL LOW
Lymphs Abs: 0.7
Lymphs Abs: 1.9
Monocytes Relative: 2 — ABNORMAL LOW
Monocytes Relative: 5
Neutro Abs: 12.2 — ABNORMAL HIGH
Neutrophils Relative %: 82 — ABNORMAL HIGH
Neutrophils Relative %: 93 — ABNORMAL HIGH

## 2010-11-08 LAB — LIPASE, BLOOD: Lipase: 20

## 2010-11-08 LAB — BASIC METABOLIC PANEL
CO2: 28
Chloride: 98
GFR calc Af Amer: >60
Glucose, Bld: 124 — ABNORMAL HIGH
Potassium: 4.4
Sodium: 132 — ABNORMAL LOW

## 2011-08-04 ENCOUNTER — Emergency Department (HOSPITAL_COMMUNITY)
Admission: EM | Admit: 2011-08-04 | Discharge: 2011-08-04 | Disposition: A | Discharge status: Home / Self Care | Payer: BC Managed Care – PPO | Attending: Emergency Medicine | Admitting: Emergency Medicine

## 2011-08-04 ENCOUNTER — Encounter (HOSPITAL_COMMUNITY): Payer: Self-pay | Admitting: *Deleted

## 2011-08-04 DIAGNOSIS — F17200 Nicotine dependence, unspecified, uncomplicated: Secondary | ICD-10-CM | POA: Insufficient documentation

## 2011-08-04 DIAGNOSIS — M7989 Other specified soft tissue disorders: Secondary | ICD-10-CM | POA: Insufficient documentation

## 2011-08-04 DIAGNOSIS — R609 Edema, unspecified: Secondary | ICD-10-CM | POA: Insufficient documentation

## 2011-08-04 HISTORY — DX: Crohn's disease, unspecified, without complications: K50.90

## 2011-08-04 LAB — CBC WITH DIFFERENTIAL/PLATELET
Basophils Relative: 0 % (ref 0–1)
Eosinophils Absolute: 0.3 10*3/uL (ref 0.0–0.7)
Eosinophils Relative: 3 % (ref 0–5)
Lymphs Abs: 2.3 10*3/uL (ref 0.7–4.0)
MCH: 29.8 pg (ref 26.0–34.0)
MCHC: 34.2 g/dL (ref 30.0–36.0)
MCV: 87.1 fL (ref 78.0–100.0)
Neutrophils Relative %: 70 % (ref 43–77)
Platelets: 271 10*3/uL (ref 150–400)
RBC: 4.57 MIL/uL (ref 4.22–5.81)
RDW: 13.6 % (ref 11.5–15.5)

## 2011-08-04 LAB — BASIC METABOLIC PANEL
Calcium: 8.9 mg/dL (ref 8.4–10.5)
GFR calc Af Amer: >90 mL/min (ref >90)
GFR calc non Af Amer: >90 mL/min (ref >90)
Glucose, Bld: 90 mg/dL (ref 70–99)
Potassium: 3.6 mEq/L (ref 3.5–5.1)
Sodium: 138 mEq/L (ref 135–145)

## 2011-08-04 LAB — PROTIME-INR: INR: 0.94 (ref 0.00–1.49)

## 2011-08-04 MED ORDER — ENOXAPARIN SODIUM 150 MG/ML ~~LOC~~ SOLN
1.0000 mg/kg | Freq: Two times a day (BID) | SUBCUTANEOUS | Status: DC
  Administered 2011-08-04: 135 mg via SUBCUTANEOUS
  Filled 2011-08-04: qty 1

## 2011-08-04 MED ORDER — HYDROCODONE-ACETAMINOPHEN 5-325 MG PO TABS: 1.0000 | ORAL_TABLET | ORAL | Status: AC | PRN

## 2011-08-04 MED ORDER — HYDROCODONE-ACETAMINOPHEN 5-325 MG PO TABS
1.0000 | ORAL_TABLET | Freq: Once | ORAL | Status: AC
  Administered 2011-08-04: 1 via ORAL
  Filled 2011-08-04: qty 1

## 2011-08-04 NOTE — ED Notes (Signed)
Pt states woke up Sunday with right calf swollen; worse today; red and warm; tender

## 2011-08-04 NOTE — ED Provider Notes (Signed)
History     CSN: [id number]  Arrival date & time 08/04/11  2014   First MD Initiated Contact with Patient 08/04/11 2106      Chief Complaint  Patient presents with  . Leg Swelling     HPI Pt noticed calf swelling today.  He has had some increasing pain associated with redness.  Walking and palpation increases the pain.  The pain is moderate.  No previous history of this.  No history of DVT.  Recent car ride (6 hours) on Wednesday.  Pt did stop on the way.  Past Medical History  Diagnosis Date  . Crohn disease     Past Surgical History  Procedure Date  . Colon surgery     No family history on file.  History  Substance Use Topics  . Smoking status: Current Everyday Smoker -- 1.0 packs/day  . Smokeless tobacco: Not on file  . Alcohol Use: Yes     social      Review of Systems  Respiratory: Negative for cough and shortness of breath.   Cardiovascular: Negative for chest pain.  All other systems reviewed and are negative.    Allergies  Review of patient's allergies indicates no known allergies.  Home Medications  No current outpatient prescriptions on file.  BP 134/78  Pulse 84  Temp 98.6 F (37 C) (Oral)  Resp 18  Ht 5\' 9"  (1.753 m)  Wt 290 lb (131.543 kg)  BMI 42.83 kg/m2  SpO2 100%  Physical Exam  Nursing note and vitals reviewed. Constitutional: He appears well-developed and well-nourished. No distress.  HENT:  Head: Normocephalic and atraumatic.  Right Ear: External ear normal.  Left Ear: External ear normal.  Eyes: Conjunctivae are normal. Right eye exhibits no discharge. Left eye exhibits no discharge. No scleral icterus.  Neck: Neck supple. No tracheal deviation present.  Cardiovascular: Normal rate, regular rhythm and intact distal pulses.   Pulmonary/Chest: Effort normal and breath sounds normal. No stridor. No respiratory distress. He has no wheezes. He has no rales.  Abdominal: Soft. Bowel sounds are normal. He exhibits no distension.  There is no tenderness. There is no rebound and no guarding.  Musculoskeletal: He exhibits edema and tenderness.       Right lower leg: He exhibits tenderness, swelling and edema. He exhibits no deformity and no laceration.       Edema right calf, tenderness palpation, mild erythema,  Neurological: He is alert. He has normal strength. No sensory deficit. Cranial nerve deficit: no gross deficits. He exhibits normal muscle tone. He displays no seizure activity. Coordination normal.  Skin: Skin is warm and dry. No rash noted.  Psychiatric: He has a normal mood and affect.    ED Course  Procedures (including critical care time)  Labs Reviewed  CBC WITH DIFFERENTIAL - Abnormal; Notable for the following:    WBC 11.0 (*)     All other components within normal limits  BASIC METABOLIC PANEL  PROTIME-INR  APTT   No results found.   1. Leg swelling       MDM  The patient's symptoms are suggestive of a deep venous thrombosis. I doubt a cellulitis at this point. Doppler ultrasound is unavailable this evening. I will give the patient a dose of Lovenox and have him followup tomorrow for a Doppler study as per our protocol. He has no symptoms to suggest pulmonary embolism. He denies chest pain shortness of breath. I've discussed these findings with the patient  Celene Kras, MD 08/04/11 2239

## 2011-08-04 NOTE — Progress Notes (Signed)
ANTICOAGULATION CONSULT NOTE - Initial Consult  Pharmacy Consult for Lovenox Indication: R/O DVT, Doppler in am 7/9  No Known Allergies  Patient Measurements: Height: 5\' 9"  (175.3 cm) Weight: 295 lb (133.811 kg) IBW/kg (Calculated) : 70.7    Vital Signs: Temp: 98.6 F (37 C) (07/08 2040) Temp src: Oral (07/08 2040) BP: 134/78 mmHg (07/08 2040) Pulse Rate: 84  (07/08 2040)  Labs:  [facility] 08/04/11 2120  HGB 13.6  HCT 39.8  PLT 271  APTT 34  LABPROT 12.8  INR 0.94  HEPARINUNFRC --  CREATININE 0.87  CKTOTAL --  CKMB --  TROPONINI --    Estimated Creatinine Clearance: 156.2 ml/min (by C-G formula based on Cr of 0.87).   Medical History: Past Medical History  Diagnosis Date  . Crohn disease     Medications:  Scheduled:    . enoxaparin (LOVENOX) injection  1 mg/kg Subcutaneous Q12H  . HYDROcodone-acetaminophen  1 tablet Oral Once   Infusions:    Assessment: 41 yo male c/o calf swelling.  MD ordering Lovenox x1 and pt to return in am for Doppler.  Goal of Therapy:  Anti-Xa level 0.6-1.2 units/ml 4hrs after LMWH dose given    Plan:   Lovenox 135mg  SQ q12h. (1mg /kg)  F/U SCr/levels as needed.  Susanne Greenhouse R 08/04/2011,10:56 PM

## 2011-08-05 ENCOUNTER — Encounter (HOSPITAL_COMMUNITY): Payer: Self-pay | Admitting: *Deleted

## 2011-08-05 ENCOUNTER — Ambulatory Visit (HOSPITAL_COMMUNITY)
Admission: RE | Admit: 2011-08-05 | Discharge: 2011-08-05 | Disposition: A | Discharge status: Home / Self Care | Payer: BC Managed Care – PPO | Source: Ambulatory Visit | Attending: Emergency Medicine | Admitting: Emergency Medicine

## 2011-08-05 DIAGNOSIS — M7989 Other specified soft tissue disorders: Secondary | ICD-10-CM | POA: Insufficient documentation

## 2011-08-05 NOTE — Progress Notes (Addendum)
*  Preliminary Results* Right lower extremity venous duplex completed. Right lower extremity is negative for deep vein thrombosis. There appears to be a small collection of fluid measuring 4.8cm in the popliteal fossa, suggestive of a Baker's cyst. There is evidence of interstitial fluid in the distal calf.  08/05/2011 8:40 AM Gertie Fey, RDMS, RDCS

## 2011-10-14 ENCOUNTER — Ambulatory Visit (INDEPENDENT_AMBULATORY_CARE_PROVIDER_SITE_OTHER): Payer: BC Managed Care – PPO | Admitting: Family Medicine

## 2011-10-14 ENCOUNTER — Encounter: Payer: Self-pay | Admitting: Family Medicine

## 2011-10-14 VITALS — BP 110/82 | HR 90 | Temp 98.6°F | Ht 69.0 in | Wt 301.6 lb

## 2011-10-14 DIAGNOSIS — D179 Benign lipomatous neoplasm, unspecified: Secondary | ICD-10-CM

## 2011-10-14 DIAGNOSIS — R600 Localized edema: Secondary | ICD-10-CM | POA: Insufficient documentation

## 2011-10-14 DIAGNOSIS — N529 Male erectile dysfunction, unspecified: Secondary | ICD-10-CM

## 2011-10-14 DIAGNOSIS — K5090 Crohn's disease, unspecified, without complications: Secondary | ICD-10-CM

## 2011-10-14 DIAGNOSIS — R609 Edema, unspecified: Secondary | ICD-10-CM

## 2011-10-14 LAB — CBC WITH DIFFERENTIAL/PLATELET
Basophils Absolute: 0 10*3/uL (ref 0.0–0.1)
Eosinophils Absolute: 0.2 10*3/uL (ref 0.0–0.7)
HCT: 42 % (ref 39.0–52.0)
Hemoglobin: 14 g/dL (ref 13.0–17.0)
Lymphocytes Relative: 28.4 % (ref 12.0–46.0)
Lymphs Abs: 2.2 10*3/uL (ref 0.7–4.0)
MCHC: 33.3 g/dL (ref 30.0–36.0)
Neutro Abs: 4.6 10*3/uL (ref 1.4–7.7)
Platelets: 253 10*3/uL (ref 150.0–400.0)
RDW: 13.4 % (ref 11.5–14.6)

## 2011-10-14 LAB — LIPID PANEL
Cholesterol: 254 mg/dL — ABNORMAL HIGH (ref 0–200)
HDL: 33.1 mg/dL — ABNORMAL LOW (ref >39.00)
Total CHOL/HDL Ratio: 8
Triglycerides: 655 mg/dL — ABNORMAL HIGH (ref 0.0–149.0)
VLDL: 131 mg/dL — ABNORMAL HIGH (ref 0.0–40.0)

## 2011-10-14 LAB — BASIC METABOLIC PANEL
Calcium: 8.7 mg/dL (ref 8.4–10.5)
Creatinine, Ser: 0.8 mg/dL (ref 0.4–1.5)
GFR: 117.71 mL/min (ref >60.00)
Sodium: 140 mEq/L (ref 135–145)

## 2011-10-14 LAB — HEPATIC FUNCTION PANEL
Bilirubin, Direct: 0 mg/dL (ref 0.0–0.3)
Total Bilirubin: 0.5 mg/dL (ref 0.3–1.2)

## 2011-10-14 NOTE — Patient Instructions (Addendum)
Schedule your complete physical at your convenience We'll notify you of your lab results and make any changes if needed Try and get regular exercise Go get your feet checked at [employer] to determine what shoes are best for you Think about Weight Watchers to lose weight The fatty lumps are called Lipomas and are safe Call with any questions or concerns Welcome!  We're glad to have you!!!

## 2011-10-14 NOTE — Assessment & Plan Note (Signed)
New to provider.  Pt currently on Levitra from previous MD.  Has concerns about low T given young age.  Will check labs.

## 2011-10-14 NOTE — Assessment & Plan Note (Signed)
New.  Encouraged healthy diet and regular exercise.  Discussed Weight Watchers.  Discouraged use of stimulants as appetite suppressants.  Will check labs to risk stratify.  Will follow closely.

## 2011-10-14 NOTE — Assessment & Plan Note (Signed)
New.  Provided pt reassurance re dx and explained that this is often genetic- pt admits mom w/ similar.  Will follow.

## 2011-10-14 NOTE — Progress Notes (Signed)
  Subjective:    Patient ID: [name], male    DOB: 10-06-1970, 41 y.o.   MRN: [id number]  HPI New to establish.  Previous MD- Jeanie Sewer [facility])  Crohn's Disease- following w/ Dr Marina Goodell, not currently on meds.  S/p colon resection x2.  Last flare 'years ago'.  Has not had recent colonoscopy.  LE edema- pt reports degenerative changes to knees, went out of town for 7/4, had family picnic, woke up and R calf was swollen.  Leg became red, sore, tender.  Had [place] to r/o DVT.  Went to see PCP who discussed dietary intake of salt.  Does not add salt to diet.  Started Maxzide for 1 week to improve swelling.  Swelling improved.  Never followed up.  + family hx of gout.  No current sxs.  Lumps under the skin- has one on R forearm, R upper arm, R back, L upper thigh.  Not painful, not growing or changing shape.  No redness or drainage.  Mom w/ similar.  Obesity- quit smoking 1 year ago, attempting to get back to the gym.  Wants to lose weight.  Has questions about which type of shoe to buy, questions about supplements/stimulants.   Review of Systems For ROS see HPI     Objective:   Physical Exam  Vitals reviewed. Constitutional: He is oriented to person, place, and time. He appears well-developed and well-nourished. No distress.       obese  HENT:  Head: Normocephalic and atraumatic.  Eyes: Conjunctivae normal and EOM are normal. Pupils are equal, round, and reactive to light.  Neck: Normal range of motion. Neck supple. No thyromegaly present.  Cardiovascular: Normal rate, regular rhythm, normal heart sounds and intact distal pulses.   No murmur heard. Pulmonary/Chest: Effort normal and breath sounds normal. No respiratory distress.  Abdominal: Soft. Bowel sounds are normal. He exhibits no distension.  Musculoskeletal: He exhibits no edema.  Lymphadenopathy:    He has no cervical adenopathy.  Neurological: He is alert and oriented to person, place, and time. No cranial nerve deficit.    Skin: Skin is warm and dry.       Multiple small lipomas present- largest on R forearm measuring 3cm x5 cm, no TTP  Psychiatric: He has a normal mood and affect. His behavior is normal.          Assessment & Plan:

## 2011-10-14 NOTE — Assessment & Plan Note (Signed)
New to provider, chronic for pt.  S/p bowel resection x2.  Has not followed regularly w/ GI and is not currently on meds.  Refer back to GI for ongoing management.

## 2011-10-14 NOTE — Assessment & Plan Note (Signed)
New to provider.  Pt reports he had very swollen leg this summer and had w/u for DVT.  This was thankfully negative.  No longer having swelling.  No cause identified but thought to be diet related.  sxs improved w/ oral diuretic.  Will follow.

## 2011-10-15 LAB — TESTOSTERONE, FREE, TOTAL, SHBG: Testosterone-% Free: 2.9 % (ref 1.6–2.9)

## 2011-10-17 ENCOUNTER — Telehealth: Payer: Self-pay

## 2011-10-17 MED ORDER — FENOFIBRATE 160 MG PO TABS: 160.0000 mg | ORAL_TABLET | Freq: Every day | ORAL | Status: DC

## 2011-10-17 NOTE — Telephone Encounter (Signed)
Pt calling back for test results.  Given results regarding triglycerides and will send rx to [facility] [place].  Pt also aware we will be referring him to urology.  He has an appt in Oct will get labs done then.  (not attached to Kim's in basket yet, so had to chart this way)

## 2011-10-31 ENCOUNTER — Encounter: Payer: Self-pay | Admitting: Family Medicine

## 2011-11-10 ENCOUNTER — Telehealth: Payer: Self-pay | Admitting: Family Medicine

## 2011-11-10 NOTE — Telephone Encounter (Signed)
In reference to Gastroenterology referral entered on 10/14/11, [facility], and I have attempted to reach patient, left messages, and I also mailed him a letter to call us.  This patient will not respond.

## 2011-11-10 NOTE — Telephone Encounter (Signed)
noted 

## 2011-12-01 ENCOUNTER — Other Ambulatory Visit: Payer: Self-pay | Admitting: Cardiology

## 2011-12-01 ENCOUNTER — Encounter: Payer: Self-pay | Admitting: Internal Medicine

## 2011-12-01 ENCOUNTER — Ambulatory Visit (INDEPENDENT_AMBULATORY_CARE_PROVIDER_SITE_OTHER): Payer: BC Managed Care – PPO

## 2011-12-01 ENCOUNTER — Ambulatory Visit (INDEPENDENT_AMBULATORY_CARE_PROVIDER_SITE_OTHER): Payer: BC Managed Care – PPO | Admitting: Internal Medicine

## 2011-12-01 ENCOUNTER — Encounter: Payer: Self-pay | Admitting: *Deleted

## 2011-12-01 VITALS — BP 134/82 | HR 96 | Temp 99.1°F | Wt 305.0 lb

## 2011-12-01 DIAGNOSIS — L0390 Cellulitis, unspecified: Secondary | ICD-10-CM

## 2011-12-01 DIAGNOSIS — L0291 Cutaneous abscess, unspecified: Secondary | ICD-10-CM

## 2011-12-01 DIAGNOSIS — M79609 Pain in unspecified limb: Secondary | ICD-10-CM

## 2011-12-01 DIAGNOSIS — R609 Edema, unspecified: Secondary | ICD-10-CM

## 2011-12-01 DIAGNOSIS — M7989 Other specified soft tissue disorders: Secondary | ICD-10-CM

## 2011-12-01 MED ORDER — DOXYCYCLINE HYCLATE 100 MG PO TABS: 100.0000 mg | ORAL_TABLET | Freq: Two times a day (BID) | ORAL | Status: DC

## 2011-12-01 NOTE — Progress Notes (Signed)
  Subjective:    Patient ID: [name], male    DOB: [birth year], 41 y.o.   MRN: [id number]  HPI Acute visit Woke up 11/28/2011 with pain and swelling of the right calf. Since then the area is not better or worse. Denies any injury although I notice a blister at the back of the calf (he thinks from a heating pad). He has been working on his feet all weekend.  Past Medical History  Diagnosis Date  . Crohn disease    Past Surgical History  Procedure Date  . Colon surgery      Review of Systems No history of DVT Denies any CP SOB No fever or chills No chest pain or shortness of breath He has a history of Crohn disease but is currently asymptomatic without nausea, vomiting, diarrhea or blood in the stools. No recent airplane trips.     Objective:   Physical Exam  Skin:      General -- alert, well-developed, and overweight appearing. No apparent distress.  Extremities--  Left leg normal Right leg: Calf is larger compared to the left side by 2 inches. There is redness and tenderness, see graphic. No fluctuance. Pedal pulse is palpable, good capillary refill in all toes. Temperature  left - right foot is similar. Neurologic-- alert & oriented X3 and strength normal in all extremities. Psych-- Cognition and judgment appear intact. Alert and cooperative with normal attention span and concentration.  not anxious appearing and not depressed appearing.        Assessment & Plan:   Right calf edema, cellulitis. Sx and findings suggest cellulitis , DVT needs to be r/o. Other considerations are dermatological manifestations of Crohn's but sx not classic for Erythema nodosum or pyoderma gangrenosa Plan: Ultrasound to rule out DVT, if positive needs to go to the ER Doxycycline for 10 days Close followup, to see PCP in 2 days ER if worse Off work x 2 day, maybe okay to go back 12/04/2011 Addendum: Phone report (-) u/s, no DVT

## 2011-12-01 NOTE — Patient Instructions (Addendum)
Get the ultrasound done today,if you have a clot I will ask you to go to the ER Leg elevation for as many hours a day as you can See your primary doctor on Wednesday, make an appointment Take antibiotics by mouth as prescribed

## 2011-12-03 ENCOUNTER — Encounter: Payer: Self-pay | Admitting: Family Medicine

## 2011-12-03 ENCOUNTER — Ambulatory Visit (INDEPENDENT_AMBULATORY_CARE_PROVIDER_SITE_OTHER): Payer: BC Managed Care – PPO | Admitting: Family Medicine

## 2011-12-03 VITALS — BP 112/80 | HR 86 | Temp 98.2°F | Resp 16 | Wt 312.4 lb

## 2011-12-03 DIAGNOSIS — L02419 Cutaneous abscess of limb, unspecified: Secondary | ICD-10-CM | POA: Insufficient documentation

## 2011-12-03 DIAGNOSIS — L03119 Cellulitis of unspecified part of limb: Secondary | ICD-10-CM | POA: Insufficient documentation

## 2011-12-03 NOTE — Progress Notes (Signed)
  Subjective:    Patient ID: JOSEMIGUEL GRIES, male    DOB: Dec 28, 1970, 41 y.o.   MRN: [id number]  HPI Leg swelling/cellulitis- saw Dr Drue Novel 2 days ago for R lower leg swelling and redness.  Was negative for DVT.  Started Doxy on Monday.  Pt reports pain is improving and swelling has decreased.  Still has blister on upper calf   Review of Systems For ROS see HPI     Objective:   Physical Exam  Vitals reviewed. Constitutional: He appears well-developed and well-nourished. No distress.  Musculoskeletal: He exhibits edema (of R lower leg, has decreased by 1/2 inch since Monday).  Skin: Skin is warm and dry. There is erythema (R lower leg laterally, mildly TTP).       Bullous lesion w/ clear fluid on R upper calf consistent w/ 2nd degree burn          Assessment & Plan:

## 2011-12-03 NOTE — Patient Instructions (Addendum)
This is definitely a cellulitis Continue the Doxy twice daily w/ food ICE- 20 minutes at a time as you are able Elevate your leg as you are able Call with any questions or concerns Hang in there!!!

## 2011-12-03 NOTE — Assessment & Plan Note (Signed)
New to provider.  Improving since visit on 11/4 according to pt and PE findings.  Continue Doxy.  Switch from heat to ice.  Reviewed supportive care and red flags that should prompt return.  Pt expressed understanding and is in agreement w/ plan.

## 2011-12-15 ENCOUNTER — Encounter: Payer: Self-pay | Admitting: Family Medicine

## 2011-12-15 ENCOUNTER — Ambulatory Visit (INDEPENDENT_AMBULATORY_CARE_PROVIDER_SITE_OTHER): Payer: BC Managed Care – PPO | Admitting: Family Medicine

## 2011-12-15 VITALS — BP 110/70 | HR 93 | Temp 98.8°F | Ht 70.0 in | Wt 312.0 lb

## 2011-12-15 DIAGNOSIS — E781 Pure hyperglyceridemia: Secondary | ICD-10-CM | POA: Insufficient documentation

## 2011-12-15 DIAGNOSIS — Z23 Encounter for immunization: Secondary | ICD-10-CM

## 2011-12-15 DIAGNOSIS — Z0000 Encounter for general adult medical examination without abnormal findings: Secondary | ICD-10-CM | POA: Insufficient documentation

## 2011-12-15 LAB — LIPID PANEL
HDL: 29.3 mg/dL — ABNORMAL LOW (ref >39.00)
Triglycerides: 205 mg/dL — ABNORMAL HIGH (ref 0.0–149.0)
VLDL: 41 mg/dL — ABNORMAL HIGH (ref 0.0–40.0)

## 2011-12-15 LAB — HEPATIC FUNCTION PANEL
Alkaline Phosphatase: 66 U/L (ref 39–117)
Bilirubin, Direct: 0 mg/dL (ref 0.0–0.3)
Total Protein: 7.3 g/dL (ref 6.0–8.3)

## 2011-12-15 NOTE — Assessment & Plan Note (Signed)
New.  dx'd on labs at last visit.  Started fenofibrate- tolerating med w/out difficulty.  Has changed diet and is now exercising regularly.  Due for LFTs, would also like trigs checked to assess for improvement.

## 2011-12-15 NOTE — Assessment & Plan Note (Signed)
Pt's PE WNL w/ exception of obesity.  Pt is working out regularly and has changed diet- applauded efforts.  Reviewed previous labs.  EKG done- see document for interpretation.  Anticipatory guidance provided.

## 2011-12-15 NOTE — Progress Notes (Signed)
  Subjective:    Patient ID: [name], male    DOB: [birth year], 41 y.o.   MRN: [id number]  HPI CPE- no concerns today, had labs done in September.  Hypertriglyceridemia- tolerating fenofibrate, had biometric screening at work the end of Oct that showed trigs at 325.  Due for repeat labs.  Has resumed exercise, eating better.   Review of Systems Patient reports no vision/hearing changes, anorexia, fever ,adenopathy, persistant/recurrent hoarseness, swallowing issues, chest pain, palpitations, edema, persistant/recurrent cough, hemoptysis, dyspnea (rest,exertional, paroxysmal nocturnal), gastrointestinal  bleeding (melena, rectal bleeding), abdominal pain, excessive heart burn, GU symptoms (dysuria, hematuria, voiding/incontinence issues) syncope, focal weakness, memory loss, numbness & tingling, skin/hair/nail changes, depression, anxiety, abnormal bruising/bleeding, musculoskeletal symptoms/signs.     Objective:   Physical Exam BP 110/70  Pulse 93  Temp 98.8 F (37.1 C) (Oral)  Ht 5\' 10"  (1.778 m)  Wt 312 lb (141.522 kg)  BMI 44.77 kg/m2  SpO2 98%  General Appearance:    Alert, cooperative, no distress, appears stated age, obese  Head:    Normocephalic, without obvious abnormality, atraumatic  Eyes:    PERRL, conjunctiva/corneas clear, EOM's intact, fundi    benign, both eyes       Ears:    Normal TM's and external ear canals, both ears  Nose:   Nares normal, septum midline, mucosa normal, no drainage   or sinus tenderness  Throat:   Lips, mucosa, and tongue normal; teeth and gums normal  Neck:   Supple, symmetrical, trachea midline, no adenopathy;       thyroid:  No enlargement/tenderness/nodules  Back:     Symmetric, no curvature, ROM normal, no CVA tenderness  Lungs:     Clear to auscultation bilaterally, respirations unlabored  Chest wall:    No tenderness or deformity  Heart:    Regular rate and rhythm, S1 and S2 normal, no murmur, rub   or gallop  Abdomen:     Soft,  non-tender, bowel sounds active all four quadrants,    no masses, no organomegaly  Genitalia:    Normal male without lesion, discharge or tenderness  Rectal:    Deferred due to young age  Extremities:   Extremities normal, atraumatic, no cyanosis or edema  Pulses:   2+ and symmetric all extremities  Skin:   Skin color, texture, turgor normal, no rashes or lesions  Lymph nodes:   Cervical, supraclavicular, and axillary nodes normal  Neurologic:   CNII-XII intact. Normal strength, sensation and reflexes      throughout          Assessment & Plan:

## 2011-12-15 NOTE — Patient Instructions (Addendum)
Follow up in 6 months to recheck cholesterol Keep up the good work on healthy food choices and regular exercise- you're doing great! Start an antibacterial soap or body wash to prevent recurrent skin infections Call with any questions or concerns Happy Holidays!

## 2011-12-16 ENCOUNTER — Encounter: Payer: Self-pay | Admitting: *Deleted

## 2012-01-22 ENCOUNTER — Telehealth: Payer: Self-pay | Admitting: *Deleted

## 2012-01-22 MED ORDER — FENOFIBRATE 160 MG PO TABS: 160.0000 mg | ORAL_TABLET | Freq: Every day | ORAL | Status: DC

## 2012-01-22 NOTE — Telephone Encounter (Signed)
Pt left VM that he has no more refill on med for his triglycerides and is unsure if he needs to continue with this med or not. Spoke with Pt advise that he is to continue with med and f/u in 6 months, Rx sent in to pharmacy.

## 2012-03-27 ENCOUNTER — Encounter (HOSPITAL_COMMUNITY): Payer: Self-pay | Admitting: Radiology

## 2012-03-27 ENCOUNTER — Emergency Department (HOSPITAL_COMMUNITY): Payer: BC Managed Care – PPO

## 2012-03-27 ENCOUNTER — Inpatient Hospital Stay (HOSPITAL_COMMUNITY)
Admission: EM | Admit: 2012-03-27 | Discharge: 2012-03-30 | DRG: 179 | Disposition: A | Discharge status: Home / Self Care | Payer: BC Managed Care – PPO | Attending: Internal Medicine | Admitting: Internal Medicine

## 2012-03-27 DIAGNOSIS — K5669 Other intestinal obstruction: Secondary | ICD-10-CM | POA: Diagnosis present

## 2012-03-27 DIAGNOSIS — E66813 Obesity, class 3: Secondary | ICD-10-CM | POA: Diagnosis present

## 2012-03-27 DIAGNOSIS — E785 Hyperlipidemia, unspecified: Secondary | ICD-10-CM | POA: Diagnosis present

## 2012-03-27 DIAGNOSIS — Z87891 Personal history of nicotine dependence: Secondary | ICD-10-CM

## 2012-03-27 DIAGNOSIS — K5000 Crohn's disease of small intestine without complications: Principal | ICD-10-CM | POA: Diagnosis present

## 2012-03-27 DIAGNOSIS — K5090 Crohn's disease, unspecified, without complications: Secondary | ICD-10-CM | POA: Diagnosis present

## 2012-03-27 DIAGNOSIS — E781 Pure hyperglyceridemia: Secondary | ICD-10-CM | POA: Diagnosis present

## 2012-03-27 DIAGNOSIS — Z9049 Acquired absence of other specified parts of digestive tract: Secondary | ICD-10-CM

## 2012-03-27 DIAGNOSIS — Z6841 Body Mass Index (BMI) 40.0 and over, adult: Secondary | ICD-10-CM

## 2012-03-27 DIAGNOSIS — Z9119 Patient's noncompliance with other medical treatment and regimen: Secondary | ICD-10-CM

## 2012-03-27 DIAGNOSIS — Z91199 Patient's noncompliance with other medical treatment and regimen due to unspecified reason: Secondary | ICD-10-CM

## 2012-03-27 DIAGNOSIS — E860 Dehydration: Secondary | ICD-10-CM | POA: Diagnosis present

## 2012-03-27 DIAGNOSIS — R000 Tachycardia, unspecified: Secondary | ICD-10-CM

## 2012-03-27 DIAGNOSIS — G4733 Obstructive sleep apnea (adult) (pediatric): Secondary | ICD-10-CM | POA: Diagnosis present

## 2012-03-27 DIAGNOSIS — Z79899 Other long term (current) drug therapy: Secondary | ICD-10-CM

## 2012-03-27 DIAGNOSIS — R112 Nausea with vomiting, unspecified: Secondary | ICD-10-CM | POA: Diagnosis present

## 2012-03-27 HISTORY — DX: Obstructive sleep apnea (adult) (pediatric): Z99.89

## 2012-03-27 HISTORY — DX: Obstructive sleep apnea (adult) (pediatric): G47.33

## 2012-03-27 LAB — URINALYSIS, ROUTINE W REFLEX MICROSCOPIC
Bilirubin Urine: NEGATIVE
Ketones, ur: NEGATIVE mg/dL
Nitrite: NEGATIVE
Protein, ur: NEGATIVE mg/dL
pH: 5 (ref 5.0–8.0)

## 2012-03-27 LAB — COMPREHENSIVE METABOLIC PANEL
Albumin: 3.8 g/dL (ref 3.5–5.2)
Alkaline Phosphatase: 76 U/L (ref 39–117)
BUN: 22 mg/dL (ref 6–23)
CO2: 26 mEq/L (ref 19–32)
Chloride: 97 mEq/L (ref 96–112)
GFR calc non Af Amer: 80 mL/min — ABNORMAL LOW (ref >90)
Glucose, Bld: 104 mg/dL — ABNORMAL HIGH (ref 70–99)
Potassium: 4 mEq/L (ref 3.5–5.1)
Total Bilirubin: 0.4 mg/dL (ref 0.3–1.2)

## 2012-03-27 LAB — CBC
HCT: 48.2 % (ref 39.0–52.0)
Hemoglobin: 16.4 g/dL (ref 13.0–17.0)
MCV: 84.1 fL (ref 78.0–100.0)
RBC: 5.73 MIL/uL (ref 4.22–5.81)
RDW: 13.6 % (ref 11.5–15.5)
WBC: 22.5 10*3/uL — ABNORMAL HIGH (ref 4.0–10.5)

## 2012-03-27 LAB — LIPASE, BLOOD: Lipase: 31 U/L (ref 11–59)

## 2012-03-27 MED ORDER — IOHEXOL 300 MG/ML  SOLN
100.0000 mL | Freq: Once | INTRAMUSCULAR | Status: AC | PRN
  Administered 2012-03-27: 100 mL via INTRAVENOUS

## 2012-03-27 MED ORDER — SODIUM CHLORIDE 0.9 % IV BOLUS (SEPSIS)
1000.0000 mL | Freq: Once | INTRAVENOUS | Status: AC
  Administered 2012-03-27: 1000 mL via INTRAVENOUS

## 2012-03-27 MED ORDER — METRONIDAZOLE IN NACL 5-0.79 MG/ML-% IV SOLN
500.0000 mg | Freq: Once | INTRAVENOUS | Status: AC
  Administered 2012-03-27: 500 mg via INTRAVENOUS
  Filled 2012-03-27: qty 100

## 2012-03-27 MED ORDER — ONDANSETRON HCL 4 MG/2ML IJ SOLN
4.0000 mg | Freq: Once | INTRAMUSCULAR | Status: AC
  Administered 2012-03-27: 4 mg via INTRAVENOUS
  Filled 2012-03-27: qty 2

## 2012-03-27 MED ORDER — CIPROFLOXACIN IN D5W 400 MG/200ML IV SOLN
400.0000 mg | Freq: Once | INTRAVENOUS | Status: AC
  Administered 2012-03-27: 400 mg via INTRAVENOUS
  Filled 2012-03-27: qty 200

## 2012-03-27 MED ORDER — ACETAMINOPHEN 325 MG PO TABS
650.0000 mg | ORAL_TABLET | Freq: Once | ORAL | Status: AC
  Administered 2012-03-27: 650 mg via ORAL
  Filled 2012-03-27: qty 2

## 2012-03-27 MED ORDER — IOHEXOL 300 MG/ML  SOLN
50.0000 mL | Freq: Once | INTRAMUSCULAR | Status: AC | PRN
  Administered 2012-03-27: 50 mL via ORAL

## 2012-03-27 MED ORDER — METHYLPREDNISOLONE SODIUM SUCC 125 MG IJ SOLR
125.0000 mg | Freq: Once | INTRAMUSCULAR | Status: AC
  Administered 2012-03-27: 125 mg via INTRAVENOUS
  Filled 2012-03-27: qty 2

## 2012-03-27 MED ORDER — MORPHINE SULFATE 4 MG/ML IJ SOLN
4.0000 mg | Freq: Once | INTRAMUSCULAR | Status: AC
  Administered 2012-03-27: 4 mg via INTRAVENOUS
  Filled 2012-03-27: qty 1

## 2012-03-27 MED ORDER — HYDROMORPHONE HCL PF 1 MG/ML IJ SOLN
1.0000 mg | Freq: Once | INTRAMUSCULAR | Status: AC
  Administered 2012-03-27: 1 mg via INTRAVENOUS
  Filled 2012-03-27: qty 1

## 2012-03-27 NOTE — ED Notes (Signed)
Pt placed on 1L [place] of O2 d/t O2 sat dropping to 89%.  Pt also tachypnea.

## 2012-03-27 NOTE — ED Provider Notes (Signed)
History     CSN: [id number]  Arrival date & time 03/27/12  1754   First MD Initiated Contact with Patient 03/27/12 1808      No chief complaint on file.   (Consider location/radiation/quality/duration/timing/severity/associated sxs/prior treatment) HPI Pt presents with c/o right sided lower abdominal pain.  Symptoms started earlier today. He also c/o nausea.  Pt is on no meds for crohns.  States his last flare up was approx 5 years ago.  No fever/chills.  Pain is sharp and soreness, constant and moderate.  He has not tried anything for his symptoms.  There are no other associated systemic symptoms, there are no other alleviating or modifying factors.   Past Medical History  Diagnosis Date  . Crohn disease   . Hypertriglyceridemia   . Low testosterone   . OSA on CPAP     Past Surgical History  Procedure Laterality Date  . Colon surgery      Family History  Problem Relation Age of Onset  . Arthritis Mother   . Diabetes Mother   . Cancer Mother     History  Substance Use Topics  . Smoking status: Former Games developer  . Smokeless tobacco: Not on file     Comment: quit a month ago, was smoking a pack a day for 20 years  . Alcohol Use: Yes     Comment: social      Review of Systems ROS reviewed and all otherwise negative except for mentioned in HPI  Allergies  Review of patient's allergies indicates no known allergies.  Home Medications   No current outpatient prescriptions on file.  BP 120/73  Pulse 98  Temp(Src) 97.6 F (36.4 C) (Oral)  Resp 18  Ht 5' 10.08" (1.78 m)  Wt 309 lb 8.4 oz (140.4 kg)  BMI 44.31 kg/m2  SpO2 93% Vitals reviewed Physical Exam Physical Examination: General appearance - alert, well appearing, and in no distress Mental status - alert, oriented to person, place, and time Eyes - no scleral icterus, no conjunctival injection Mouth - mucous membranes moist, pharynx normal without lesions Chest - clear to auscultation, no wheezes, rales  or rhonchi, symmetric air entry Heart - normal rate, regular rhythm, normal S1, S2, no murmurs, rubs, clicks or gallops Abdomen - soft, tender to palpation in right lower abdomen, no gaurding or rebound, nabs, nondistended, no masses or organomegaly Extremities - peripheral pulses normal, no pedal edema, no clubbing or cyanosis Skin - normal coloration and turgor, no rashes  ED Course  Procedures (including critical care time)  CRITICAL CARE Performed by: Ethelda Chick   Total critical care time: 45  Critical care time was exclusive of separately billable procedures and treating other patients.  Critical care was necessary to treat or prevent imminent or life-threatening deterioration.  Critical care was time spent personally by me on the following activities: development of treatment plan with patient and/or surrogate as well as nursing, discussions with consultants, evaluation of patient's response to treatment, examination of patient, obtaining history from patient or surrogate, ordering and performing treatments and interventions, ordering and review of laboratory studies, ordering and review of radiographic studies, pulse oximetry and re-evaluation of patient's condition.  Labs Reviewed  CBC - Abnormal; Notable for the following:    WBC 22.5 (*)    All other components within normal limits  COMPREHENSIVE METABOLIC PANEL - Abnormal; Notable for the following:    Glucose, Bld 104 (*)    AST 44 (*)    GFR calc non Af  Amer 80 (*)    All other components within normal limits  URINALYSIS, ROUTINE W REFLEX MICROSCOPIC - Abnormal; Notable for the following:    Specific Gravity, Urine 1.035 (*)    All other components within normal limits  BASIC METABOLIC PANEL - Abnormal; Notable for the following:    Sodium 134 (*)    Glucose, Bld 138 (*)    Calcium 8.0 (*)    All other components within normal limits  CBC - Abnormal; Notable for the following:    WBC 14.4 (*)    All other  components within normal limits  CULTURE, BLOOD (ROUTINE X 2)  CULTURE, BLOOD (ROUTINE X 2)  LIPASE, BLOOD  LACTIC ACID, PLASMA  CBC   Dg Chest 2 View  03/27/2012  *RADIOLOGY REPORT*  Clinical Data: 42 year old male kidney with fever nausea vomiting diarrhea abdominal pain Crohn disease.  CHEST - 2 VIEW  Comparison: 09/17/2006.  Findings: Lower lung volumes.  Cardiac size at the upper limits of normal, stable. Other mediastinal contours are within normal limits.  Visualized tracheal air column is within normal limits. No pneumoperitoneum or pneumothorax.  Oral contrast in the stomach. No pulmonary edema, pleural effusion or confluent pulmonary opacity. No acute osseous abnormality identified.  IMPRESSION: Low lung volumes, otherwise no acute cardiopulmonary abnormality.   Original Report Authenticated By: Erskine Speed, M.D.    Ct Abdomen Pelvis W Contrast  03/27/2012  *RADIOLOGY REPORT*  Clinical Data: Right lower quadrant pain.  History Crohn's disease. Two prior bowel resections.  CT ABDOMEN AND PELVIS WITH CONTRAST  Technique:  Multidetector CT imaging of the abdomen and pelvis was performed following the standard protocol during bolus administration of intravenous contrast.  Contrast: 50mL OMNIPAQUE IOHEXOL 300 MG/ML  SOLN, OMNIPAQUE IOHEXOL 300 MG/ML  SOLN  Comparison: 10/16/2006.  Findings: Prior resection of bowel with ileal colonic anastomoses. Minimal stranding of fat planes adjacent to anastomose  may represent scarring from prior inflammation.  The distal small bowel loop entering the anastomoses has slightly thickened wall.  Mild inflammation not excluded. No proximal obstruction.  No free intraperitoneal air, abnormal fluid collection or discrete fistula identified.  No focal hepatic, splenic, pancreatic, adrenal or renal lesion.  No calcified gallstones.  Advanced atherosclerotic type changes with calcification at the aortic bifurcation.  Noncontrast filled views urinary bladder  unremarkable.  L5 bilateral pars defects.  Lung bases clear.  IMPRESSION:  Prior resection of bowel with ileal colonic anastomoses.  Minimal stranding of fat planes adjacent to anastomose  may represent scarring from prior inflammation.  The distal small bowel loop entering the anastomoses has slightly thickened wall.  Mild inflammation not excluded. No proximal obstruction.  No free intraperitoneal air, abnormal fluid collection or discrete fistula identified.  Atherosclerotic type changes aortic bifurcation.  L5 bilateral pars defects.   Original Report Authenticated By: Lacy Duverney, M.D.      1. Exacerbation of Crohn's disease   2. Tachycardia   3. Nausea and vomiting   4. Hypertriglyceridemia   5. Obesity, Class III, BMI 40-49.9 (morbid obesity)       MDM  Pt presenting with abdominal pain and nausea, hx of crohns but no flare in the recent past.  Pt febrile and tachycardic.  Pt treated with IV fluids and pain and nausea meds.  Abdominal CT scan findings c/w crohn's flare.  No acute complications on CT.  Pt remains tachycardic after 3 Liter IV fluid boluses.  Blood cultures obtained as well as lactate.  Pt started  on soluemdrol and IV antibiotics.  Admitted to telemetry for further management by hospitalist service.          Ethelda Chick, MD 03/28/12 [phone]

## 2012-03-27 NOTE — ED Notes (Signed)
Patient states that he is having a crohns flare up, started about 1 pm today. Last flare up was about 5 years ago.NV, 1 episode of forceful vomiting while here. Has had 2 bowel surgies thus far and BM's are normally soft.

## 2012-03-28 ENCOUNTER — Encounter (HOSPITAL_COMMUNITY): Payer: Self-pay | Admitting: Gastroenterology

## 2012-03-28 DIAGNOSIS — R112 Nausea with vomiting, unspecified: Secondary | ICD-10-CM | POA: Diagnosis present

## 2012-03-28 DIAGNOSIS — R000 Tachycardia, unspecified: Secondary | ICD-10-CM | POA: Insufficient documentation

## 2012-03-28 DIAGNOSIS — K5090 Crohn's disease, unspecified, without complications: Secondary | ICD-10-CM

## 2012-03-28 LAB — BASIC METABOLIC PANEL
Calcium: 8 mg/dL — ABNORMAL LOW (ref 8.4–10.5)
GFR calc Af Amer: >90 mL/min (ref >90)
GFR calc non Af Amer: >90 mL/min (ref >90)
Glucose, Bld: 138 mg/dL — ABNORMAL HIGH (ref 70–99)
Potassium: 3.9 mEq/L (ref 3.5–5.1)
Sodium: 134 mEq/L — ABNORMAL LOW (ref 135–145)

## 2012-03-28 LAB — CBC
MCH: 28.1 pg (ref 26.0–34.0)
Platelets: 344 10*3/uL (ref 150–400)
RBC: 5.13 MIL/uL (ref 4.22–5.81)
WBC: 14.4 10*3/uL — ABNORMAL HIGH (ref 4.0–10.5)

## 2012-03-28 MED ORDER — METHYLPREDNISOLONE SODIUM SUCC 125 MG IJ SOLR
80.0000 mg | Freq: Two times a day (BID) | INTRAMUSCULAR | Status: DC
  Filled 2012-03-28: qty 1.28

## 2012-03-28 MED ORDER — ZOLPIDEM TARTRATE 5 MG PO TABS
5.0000 mg | ORAL_TABLET | Freq: Every evening | ORAL | Status: DC | PRN
  Filled 2012-03-28: qty 1

## 2012-03-28 MED ORDER — ALUM & MAG HYDROXIDE-SIMETH 200-200-20 MG/5ML PO SUSP: 30.0000 mL | Freq: Four times a day (QID) | ORAL | Status: DC | PRN

## 2012-03-28 MED ORDER — ENOXAPARIN SODIUM 80 MG/0.8ML ~~LOC~~ SOLN
70.0000 mg | SUBCUTANEOUS | Status: DC
  Administered 2012-03-28 – 2012-03-30 (×3): 70 mg via SUBCUTANEOUS
  Filled 2012-03-28 (×3): qty 0.8

## 2012-03-28 MED ORDER — ONDANSETRON HCL 4 MG/2ML IJ SOLN: 4.0000 mg | Freq: Four times a day (QID) | INTRAMUSCULAR | Status: DC | PRN

## 2012-03-28 MED ORDER — METRONIDAZOLE IN NACL 5-0.79 MG/ML-% IV SOLN
500.0000 mg | Freq: Three times a day (TID) | INTRAVENOUS | Status: DC
  Administered 2012-03-28 – 2012-03-30 (×7): 500 mg via INTRAVENOUS
  Filled 2012-03-28 (×10): qty 100

## 2012-03-28 MED ORDER — HYDROMORPHONE HCL PF 1 MG/ML IJ SOLN
0.5000 mg | INTRAMUSCULAR | Status: DC | PRN
  Administered 2012-03-28 – 2012-03-30 (×13): 1 mg via INTRAVENOUS
  Filled 2012-03-28 (×14): qty 1

## 2012-03-28 MED ORDER — PANTOPRAZOLE SODIUM 40 MG IV SOLR
40.0000 mg | INTRAVENOUS | Status: DC
  Administered 2012-03-28 – 2012-03-30 (×3): 40 mg via INTRAVENOUS
  Filled 2012-03-28 (×3): qty 40

## 2012-03-28 MED ORDER — SODIUM CHLORIDE 0.9 % IV SOLN
INTRAVENOUS | Status: DC
  Administered 2012-03-28 – 2012-03-29 (×4): via INTRAVENOUS

## 2012-03-28 MED ORDER — ACETAMINOPHEN 650 MG RE SUPP: 650.0000 mg | Freq: Four times a day (QID) | RECTAL | Status: DC | PRN

## 2012-03-28 MED ORDER — ENOXAPARIN SODIUM 40 MG/0.4ML ~~LOC~~ SOLN
40.0000 mg | SUBCUTANEOUS | Status: DC
  Filled 2012-03-28: qty 0.4

## 2012-03-28 MED ORDER — ONDANSETRON HCL 4 MG PO TABS: 4.0000 mg | ORAL_TABLET | Freq: Four times a day (QID) | ORAL | Status: DC | PRN

## 2012-03-28 MED ORDER — OXYCODONE HCL 5 MG PO TABS
5.0000 mg | ORAL_TABLET | ORAL | Status: DC | PRN
  Administered 2012-03-28 – 2012-03-30 (×4): 5 mg via ORAL
  Filled 2012-03-28 (×6): qty 1

## 2012-03-28 MED ORDER — METHYLPREDNISOLONE SODIUM SUCC 125 MG IJ SOLR
125.0000 mg | Freq: Once | INTRAMUSCULAR | Status: DC
  Administered 2012-03-28: 125 mg via INTRAVENOUS
  Filled 2012-03-28: qty 2

## 2012-03-28 MED ORDER — METHYLPREDNISOLONE SODIUM SUCC 40 MG IJ SOLR
20.0000 mg | Freq: Three times a day (TID) | INTRAMUSCULAR | Status: DC
  Administered 2012-03-28 – 2012-03-30 (×6): 20 mg via INTRAVENOUS
  Filled 2012-03-28 (×9): qty 0.5

## 2012-03-28 MED ORDER — SODIUM CHLORIDE 0.9 % IJ SOLN
3.0000 mL | Freq: Two times a day (BID) | INTRAMUSCULAR | Status: DC
  Administered 2012-03-28: 3 mL via INTRAVENOUS

## 2012-03-28 MED ORDER — CIPROFLOXACIN IN D5W 400 MG/200ML IV SOLN
400.0000 mg | Freq: Two times a day (BID) | INTRAVENOUS | Status: DC
  Administered 2012-03-29 – 2012-03-30 (×3): 400 mg via INTRAVENOUS
  Filled 2012-03-28 (×4): qty 200

## 2012-03-28 MED ORDER — ACETAMINOPHEN 325 MG PO TABS: 650.0000 mg | ORAL_TABLET | Freq: Four times a day (QID) | ORAL | Status: DC | PRN

## 2012-03-28 NOTE — Consult Note (Signed)
Referring Provider: No ref. provider found Primary Care Physician:  Neena Rhymes, MD Primary Gastroenterologist:  Dr. Marina Goodell  Reason for Consultation:  Crohns disease  HPI: [name] is a 42 y.o. male with a history of Crohns disease and remote ileocectomy and previously followed with Dr. Marina Goodell.  He was last seen by Dr. Marina Goodell in 06/2006 at which time it was discussed with him regarding treatment with biologics and surgery.  He underwent lysis of adhesions and laparoscopically assisted ileocolonic resection with anastomosis by Dr. Michaell Cowing in 08/2006.  Never followed up with Dr. Marina Goodell after that; says that he did not have any more flares or problems after the surgery.  When he was last seen by Dr. Marina Goodell he was taking 200 mg daily.  He presented to the ED on this occasion with complaints of RLQ ABD pain along with nausea and vomiting since the morning of 3/1.  He describes having pain that is 8/10 that came on the day of ED visit and did not subside so he came for evaluation.  He has 4-5 loose to watery stools a day, which he says is normal for him; not any further increase in BM's recently.  No blood in stool.  He denies having fever but reports having chills. He says that the pain feels like previous Crohns flares.  A CT scan was performed, which showed "Prior resection of bowel with ileal colonic anastomoses. Minimal stranding of fat planes adjacent to anastomose may represent scarring from prior inflammation. The distal small bowel loop entering the anastomoses has slightly thickened wall. Mild inflammation not excluded. No proximal obstruction. No free intraperitoneal air, abnormal fluid collection or discrete  fistula identified."  He has been admitted and placed on IV cipro and flagyl along with IV solumedrol.  On clear liquids, IVF's with pain control.  Had an elevated WBC count of 22.5 on admission, but that is trending down.  Was febrile and tachycardic as well.    Past Medical  History  Diagnosis Date  . Crohn disease   . Hypertriglyceridemia   . Low testosterone   . OSA on CPAP     Past Surgical History  Procedure Laterality Date  . Colon surgery      Prior to Admission medications   Medication Sig Start Date End Date Taking? Authorizing Provider  fenofibrate 160 MG tablet Take 1 tablet (160 mg total) by mouth daily. 01/22/12  Yes Sheliah Hatch, MD  fish oil-omega-3 fatty acids 1000 MG capsule Take 1 g by mouth daily.   Yes Historical Provider, MD  Chilton Si Tea, Camillia sinensis, (GREEN TEA PO) Take 1 tablet by mouth daily.   Yes Historical Provider, MD  MILK THISTLE PO Take 1 tablet by mouth daily.    Yes Historical Provider, MD  Multiple Vitamin (MULTIVITAMIN WITH MINERALS) TABS Take 3 tablets by mouth daily.   Yes Historical Provider, MD  vitamin C (ASCORBIC ACID) 500 MG tablet Take 500 mg by mouth daily.   Yes Historical Provider, MD    Current Facility-Administered Medications  Medication Dose Route Frequency Provider Last Rate Last Dose  . 0.9 %  sodium chloride infusion   Intravenous Continuous Ron Parker, MD 125 mL/hr at 03/28/12 0254    . acetaminophen (TYLENOL) tablet 650 mg  650 mg Oral Q6H PRN Ron Parker, MD       Or  . acetaminophen (TYLENOL) suppository 650 mg  650 mg Rectal Q6H PRN Ron Parker, MD      .  alum & mag hydroxide-simeth (MAALOX/MYLANTA) 200-200-20 MG/5ML suspension 30 mL  30 mL Oral Q6H PRN Ron Parker, MD      . Melene Muller ON 03/29/2012] ciprofloxacin (CIPRO) IVPB 400 mg  400 mg Intravenous Q12H Harvette C Lovell Sheehan, MD      . enoxaparin (LOVENOX) injection 70 mg  70 mg Subcutaneous Q24H Leann Trefz Poindexter, PHARMD      . HYDROmorphone (DILAUDID) injection 0.5-1 mg  0.5-1 mg Intravenous Q3H PRN Ron Parker, MD   1 mg at 03/28/12 0732  . methylPREDNISolone sodium succinate (SOLU-MEDROL) 125 mg/2 mL injection 125 mg  125 mg Intravenous Once Ron Parker, MD      . Melene Muller ON 03/29/2012]  methylPREDNISolone sodium succinate (SOLU-MEDROL) 125 mg/2 mL injection 80 mg  80 mg Intravenous Q12H Harvette C Lovell Sheehan, MD      . metroNIDAZOLE (FLAGYL) IVPB 500 mg  500 mg Intravenous Q8H Ron Parker, MD   500 mg at 03/28/12 0742  . ondansetron (ZOFRAN) tablet 4 mg  4 mg Oral Q6H PRN Ron Parker, MD       Or  . ondansetron (ZOFRAN) injection 4 mg  4 mg Intravenous Q6H PRN Ron Parker, MD      . oxyCODONE (Oxy IR/ROXICODONE) immediate release tablet 5 mg  5 mg Oral Q4H PRN Ron Parker, MD   5 mg at 03/28/12 1478  . pantoprazole (PROTONIX) injection 40 mg  40 mg Intravenous Q24H Harvette C Jenkins, MD      . sodium chloride 0.9 % injection 3 mL  3 mL Intravenous Q12H Ron Parker, MD   3 mL at 03/28/12 0230  . zolpidem (AMBIEN) tablet 5 mg  5 mg Oral QHS PRN Ron Parker, MD        Allergies as of 03/27/2012  . (No Known Allergies)    Family History  Problem Relation Age of Onset  . Arthritis Mother   . Diabetes Mother   . Cancer Mother     History   Social History  . Marital Status: Single    Spouse Name: N/A    Number of Children: N/A  . Years of Education: N/A   Occupational History  . Not on file.   Social History Main Topics  . Smoking status: Former Games developer  . Smokeless tobacco: Not on file     Comment: quit a month ago, was smoking a pack a day for 20 years  . Alcohol Use: Yes     Comment: social  . Drug Use: No  . Sexually Active: Not on file   Other Topics Concern  . Not on file   Social History Narrative  . No narrative on file    Review of Systems: Ten point ROS is O/W negative except as mentioned in HPI.  Physical Exam: Vital signs in last 24 hours: Temp:  [98.1 F (36.7 C)-101.9 F (38.8 C)] 98.1 F (36.7 C) (03/02 0547) Pulse Rate:  [92-130] 92 (03/02 0547) Resp:  [15-30] 18 (03/02 0547) BP: (105-163)/(43-67) 110/57 mmHg (03/02 0547) SpO2:  [94 %-96 %] 96 % (03/02 0547) Weight:  [309 lb 8.4 oz (140.4  kg)] 309 lb 8.4 oz (140.4 kg) (03/02 0206) Last BM Date: 03/28/12 General:   Alert,  Well-developed, well-nourished, pleasant and cooperative in NAD Head:  Normocephalic and atraumatic. Eyes:  Sclera clear, no icterus.  Conjunctiva pink. Ears:  Normal auditory acuity. Mouth:  No deformity or lesions.   Lungs:  Clear throughout to  auscultation.  No wheezes, crackles, or rhonchi.  Heart:  Regular rate and rhythm; no murmurs, clicks, rubs,  or gallops. Abdomen:  Soft, non-distended, BS active, RLQ TTP without R/R/G. Rectal:  Deferred  Msk:  Symmetrical without gross deformities. Pulses:  Normal pulses noted. Extremities:  Without clubbing or edema. Neurologic:  Alert and  oriented x4;  grossly normal neurologically. Skin:  Intact without significant lesions or rashes. Psych:  Alert and cooperative. Normal mood and affect.  Intake/Output from previous day: 03/01 0701 - 03/02 0700 In: 240 [P.O.:240] Out: 1300 [Urine:700; Emesis/NG output:600]  Lab Results:  Recent Labs  03/27/12 1840 03/28/12 0536  WBC 22.5* 14.4*  HGB 16.4 14.4  HCT 48.2 43.6  PLT 373 344   BMET  Recent Labs  03/27/12 1840 03/28/12 0536  NA 135 134*  K 4.0 3.9  CL 97 100  CO2 26 23  GLUCOSE 104* 138*  BUN 22 18  CREATININE 1.13 0.99  CALCIUM 9.2 8.0*   LFT  Recent Labs  03/27/12 1840  PROT 7.7  ALBUMIN 3.8  AST 44*  ALT 31  ALKPHOS 76  BILITOT 0.4   Studies/Results: Dg Chest 2 View  03/27/2012  *RADIOLOGY REPORT*  Clinical Data: 42 year old male kidney with fever nausea vomiting diarrhea abdominal pain Crohn disease.  CHEST - 2 VIEW  Comparison: 09/17/2006.  Findings: Lower lung volumes.  Cardiac size at the upper limits of normal, stable. Other mediastinal contours are within normal limits.  Visualized tracheal air column is within normal limits. No pneumoperitoneum or pneumothorax.  Oral contrast in the stomach. No pulmonary edema, pleural effusion or confluent pulmonary opacity. No acute  osseous abnormality identified.  IMPRESSION: Low lung volumes, otherwise no acute cardiopulmonary abnormality.   Original Report Authenticated By: Erskine Speed, M.D.    Ct Abdomen Pelvis W Contrast  03/27/2012  *RADIOLOGY REPORT*  Clinical Data: Right lower quadrant pain.  History Crohn's disease. Two prior bowel resections.  CT ABDOMEN AND PELVIS WITH CONTRAST  Technique:  Multidetector CT imaging of the abdomen and pelvis was performed following the standard protocol during bolus administration of intravenous contrast.  Contrast: 50mL OMNIPAQUE IOHEXOL 300 MG/ML  SOLN, OMNIPAQUE IOHEXOL 300 MG/ML  SOLN  Comparison: 10/16/2006.  Findings: Prior resection of bowel with ileal colonic anastomoses. Minimal stranding of fat planes adjacent to anastomose  may represent scarring from prior inflammation.  The distal small bowel loop entering the anastomoses has slightly thickened wall.  Mild inflammation not excluded. No proximal obstruction.  No free intraperitoneal air, abnormal fluid collection or discrete fistula identified.  No focal hepatic, splenic, pancreatic, adrenal or renal lesion.  No calcified gallstones.  Advanced atherosclerotic type changes with calcification at the aortic bifurcation.  Noncontrast filled views urinary bladder unremarkable.  L5 bilateral pars defects.  Lung bases clear.  IMPRESSION:  Prior resection of bowel with ileal colonic anastomoses.  Minimal stranding of fat planes adjacent to anastomose  may represent scarring from prior inflammation.  The distal small bowel loop entering the anastomoses has slightly thickened wall.  Mild inflammation not excluded. No proximal obstruction.  No free intraperitoneal air, abnormal fluid collection or discrete fistula identified.  Atherosclerotic type changes aortic bifurcation.  L5 bilateral pars defects.   Original Report Authenticated By: Lacy Duverney, M.D.     IMPRESSION:  1.  Ileal Crohn's disease with recurrent partial small bowel   obstruction due to a combination of fixed stenotic disease and inflammatory disease. 2. Status post ileocecectomy remotely and then another  ileocolonic resection with anastomosis in 2008. 3. History of medical noncompliance. 4.  OSA on CPAP 5.  Hyperlipidemia   PLAN: -Agrees with IV abx (cipro and flagyl).  Agree with IV steroids, but will change to methylpred 20 mg every 8 hours. -Will need follow-up as outpatient for possible repeat colonoscopy and decision on long-term management.   ZEHR, JESSICA D.  03/28/2012, 10:27 AM  Pager number [phone] I have reviewed the above note, examined the patient and agree with plan of treatment.Improved but still tender in RLQ. Small bowl Crohn' disease, 2 prior resections. Will need aggressive treatment and compliance to avoid another operation. Entecort may be used as an outpatient in place of Prednisone. Consider biologicals depending on what the colonoscopy shows, may be prepped for tomorrow for Dr Arlyce Dice or as an outpatient for Dr Marina Goodell who is his gastroenterologist.  [facility] Pager # [phone]

## 2012-03-28 NOTE — Progress Notes (Addendum)
[facility] PROGRESS NOTE  [name]:[id number] DOB: [birth year] DOA: 03/27/2012 [name], MD  Brief Narrative: 42 y/o male with hx of crohn's disease presented with RLQ pain with fever and leucocytosis and CT suggestive of crohn's flare.   Assessment/Plan:  crohn's flare up: continue IV hydration, pain medications, IV solumedrol, tylenol and antiemetics Seen by Dr Marina Goodell in past and has had colectomy done for SBO. Last flare up 5 years back. ot on any mediations for it currently Called GI consult Leucocytosis improving in am lab  OSA  continue CPAP as toelrated   Code Status: full code Family Communication:none at bedside Disposition Plan: Home once stable   Consultants:  [facility]  Procedures:  none  Antibiotics:  cipro and flagyl ( day 1)   HPI/Subjective: Still has some RLQ abdominal pain but better. No fever this am but has nausea   Objective: Filed Vitals:   03/28/12 0141 03/28/12 0206 03/28/12 0547 03/28/12 0549  BP:  163/67 110/57   Pulse: 102 103 92   Temp:  98.5 F (36.9 C) 98.1 F (36.7 C)   TempSrc:  Oral Oral   Resp:  20 18   Height:    5' 10.08" (1.78 m)  Weight:  140.4 kg (309 lb 8.4 oz)    SpO2: 95% 95% 96%     Intake/Output Summary (Last 24 hours) at 03/28/12 1029 Last data filed at 03/28/12 0547  Gross per 24 hour  Intake    240 ml  Output   1300 ml  Net  -1060 ml   Filed Weights   03/28/12 0206  Weight: 140.4 kg (309 lb 8.4 oz)    Exam:   General:  Middle aged obese male in NAD  HEENT: no pallor, moist oral mucosa  Cardiovascular: NS1&S2, no murmurs  Respiratory: clear to auscultation b/l  Abdomen: soft, mild RLQ tenderness,  ND, BS+  Ext: warm, no edema  CNS: AAOX 3  Data Reviewed: Basic Metabolic Panel:  Recent Labs Lab 03/27/12 1840 03/28/12 0536  NA 135 134*  K 4.0 3.9  CL 97 100  CO2 26 23  GLUCOSE 104* 138*  BUN 22 18  CREATININE 1.13 0.99  CALCIUM 9.2 8.0*   Liver  Function Tests:  Recent Labs Lab 03/27/12 1840  AST 44*  ALT 31  ALKPHOS 76  BILITOT 0.4  PROT 7.7  ALBUMIN 3.8    Recent Labs Lab 03/27/12 1840  LIPASE 31   No results found for this basename: AMMONIA,  in the last 168 hours CBC:  Recent Labs Lab 03/27/12 1840 03/28/12 0536  WBC 22.5* 14.4*  HGB 16.4 14.4  HCT 48.2 43.6  MCV 84.1 85.0  PLT 373 344   Cardiac Enzymes: No results found for this basename: CKTOTAL, CKMB, CKMBINDEX, TROPONINI,  in the last 168 hours BNP (last 3 results) No results found for this basename: PROBNP,  in the last 8760 hours CBG: No results found for this basename: GLUCAP,  in the last 168 hours  No results found for this or any previous visit (from the past 240 hour(s)).   Studies: Dg Chest 2 View  03/27/2012  *RADIOLOGY REPORT*  Clinical Data: 42 year old male kidney with fever nausea vomiting diarrhea abdominal pain Crohn disease.  CHEST - 2 VIEW  Comparison: 09/17/2006.  Findings: Lower lung volumes.  Cardiac size at the upper limits of normal, stable. Other mediastinal contours are within normal limits.  Visualized tracheal air column is within normal limits. No pneumoperitoneum  or pneumothorax.  Oral contrast in the stomach. No pulmonary edema, pleural effusion or confluent pulmonary opacity. No acute osseous abnormality identified.  IMPRESSION: Low lung volumes, otherwise no acute cardiopulmonary abnormality.   Original Report Authenticated By: Erskine Speed, M.D.    Ct Abdomen Pelvis W Contrast  03/27/2012  *RADIOLOGY REPORT*  Clinical Data: Right lower quadrant pain.  History Crohn's disease. Two prior bowel resections.  CT ABDOMEN AND PELVIS WITH CONTRAST  Technique:  Multidetector CT imaging of the abdomen and pelvis was performed following the standard protocol during bolus administration of intravenous contrast.  Contrast: 50mL OMNIPAQUE IOHEXOL 300 MG/ML  SOLN, OMNIPAQUE IOHEXOL 300 MG/ML  SOLN  Comparison: 10/16/2006.  Findings:  Prior resection of bowel with ileal colonic anastomoses. Minimal stranding of fat planes adjacent to anastomose  may represent scarring from prior inflammation.  The distal small bowel loop entering the anastomoses has slightly thickened wall.  Mild inflammation not excluded. No proximal obstruction.  No free intraperitoneal air, abnormal fluid collection or discrete fistula identified.  No focal hepatic, splenic, pancreatic, adrenal or renal lesion.  No calcified gallstones.  Advanced atherosclerotic type changes with calcification at the aortic bifurcation.  Noncontrast filled views urinary bladder unremarkable.  L5 bilateral pars defects.  Lung bases clear.  IMPRESSION:  Prior resection of bowel with ileal colonic anastomoses.  Minimal stranding of fat planes adjacent to anastomose  may represent scarring from prior inflammation.  The distal small bowel loop entering the anastomoses has slightly thickened wall.  Mild inflammation not excluded. No proximal obstruction.  No free intraperitoneal air, abnormal fluid collection or discrete fistula identified.  Atherosclerotic type changes aortic bifurcation.  L5 bilateral pars defects.   Original Report Authenticated By: Lacy Duverney, M.D.     Scheduled Meds: . [START ON 03/29/2012] ciprofloxacin  400 mg Intravenous Q12H  . enoxaparin (LOVENOX) injection  70 mg Subcutaneous Q24H  . methylPREDNISolone (SOLU-MEDROL) injection  125 mg Intravenous Once  . [START ON 03/29/2012] methylPREDNISolone (SOLU-MEDROL) injection  80 mg Intravenous Q12H  . metronidazole  500 mg Intravenous Q8H  . pantoprazole (PROTONIX) IV  40 mg Intravenous Q24H  . sodium chloride  3 mL Intravenous Q12H   Continuous Infusions: . sodium chloride 125 mL/hr at 03/28/12 0254      Time spent: 25 minutes    [name], [name]  [facility] Pager [phone]. If 8PM-8AM, please contact night-coverage at [email or web address], password [facility] 03/28/2012, 10:29 AM  LOS: 1 day

## 2012-03-28 NOTE — H&P (Signed)
[facility] History and Physical  [name]:[id number] DOB: April 26, 1970 DOA: 03/27/2012  Referring physician: EDP [name], MD  Specialists:   Chief Complaint:   ABD Pain  HPI: [name] is a 42 y.o. male with a history of crohn disease who presents to the ED with complaints of RLQ ABD pain along with nausea and vomiting since the AM yesterday.  He describes having dull pain that is 8/10.   He has also had diarrhea.   He denies having fever but reports having chills.   He feels like he is having a crohn's flare up.   He states that he has not had a flare up of his chron's disease in 5 years.    In the ED, a ct scan was performed, which revealed thickening in the distal aspet of the  Small bowel.   His Gastroenterologist id Dr. Marina Goodell.      Review of Systems: The patient denies anorexia, fever, weight loss, vision loss, decreased hearing, hoarseness, chest pain, syncope, dyspnea on exertion, peripheral edema, balance deficits, hemoptysis, abdominal pain, melena, hematochezia, hematemesis, severe indigestion/heartburn, hematuria, dysuria,  incontinence, muscle weakness, suspicious skin lesions, transient blindness, difficulty walking, depression, unusual weight change, abnormal bleeding, enlarged lymph nodes, angioedema, and breast masses.    Past Medical History  Diagnosis Date  . Crohn disease   . Hypertriglyceridemia   . Low testosterone      Past Surgical History  Procedure Laterality Date  . Colon surgery       Medications:  HOME MEDS: Prior to Admission medications   Medication Sig Start Date End Date Taking? Authorizing Provider  fenofibrate 160 MG tablet Take 1 tablet (160 mg total) by mouth daily. 01/22/12  Yes Sheliah Hatch, MD  fish oil-omega-3 fatty acids 1000 MG capsule Take 1 g by mouth daily.   Yes Historical Provider, MD  Chilton Si Tea, Camillia sinensis, (GREEN TEA PO) Take 1 tablet by mouth daily.   Yes Historical Provider, MD   MILK THISTLE PO Take 1 tablet by mouth daily.    Yes Historical Provider, MD  Multiple Vitamin (MULTIVITAMIN WITH MINERALS) TABS Take 3 tablets by mouth daily.   Yes Historical Provider, MD  vitamin C (ASCORBIC ACID) 500 MG tablet Take 500 mg by mouth daily.   Yes Historical Provider, MD     Allergies:  No Known Allergies    Social History:   reports that he has quit smoking. He does not have any smokeless tobacco history on file. He reports that  drinks alcohol. He reports that he does not use illicit drugs.    Family History: Family History  Problem Relation Age of Onset  . Arthritis Mother   . Diabetes Mother   . Cancer Mother      Physical Exam:  GEN:  Pleasant  Morbidly Obese 42 year old Caucasian Male examined  and in discomfort but no acute distress; cooperative with exam Filed Vitals:   03/28/12 0139 03/28/12 0140 03/28/12 0141 03/28/12 0206  BP:    163/67  Pulse: 105 101 102 103  Temp:    98.5 F (36.9 C)  TempSrc:    Oral  Resp:    20  Weight:    140.4 kg (309 lb 8.4 oz)  SpO2: 94% 95% 95% 95%   Blood pressure 163/67, pulse 103, temperature 98.5 F (36.9 C), temperature source Oral, resp. rate 20, weight 140.4 kg (309 lb 8.4 oz), SpO2 95.00%. PSYCH: He is alert and oriented  x4; does not appear anxious does not appear depressed; affect is normal HEENT: Normocephalic and Atraumatic, Mucous membranes pink; PERRLA; EOM intact; Fundi:  Benign;  No scleral icterus, Nares: Patent, Oropharynx: Clear,Fair Dentition, Neck:  FROM, no cervical lymphadenopathy nor thyromegaly or carotid bruit; no JVD; Breasts:: Not examined CHEST WALL: No tenderness CHEST: Normal respiration, clear to auscultation bilaterally HEART: Regular rate and rhythm; no murmurs rubs or gallops BACK: No kyphosis or scoliosis; no CVA tenderness ABDOMEN: Positive Bowel Sounds, Obese, soft non-tender; no masses, no organomegaly.    Rectal Exam: Not done EXTREMITIES: No no cyanosis, clubbing or  edema; no ulcerations. Genitalia: not examined PULSES: 2+ and symmetric SKIN: Normal hydration no rash or ulceration CNS: Cranial nerves 2-12 grossly intact no focal neurologic deficit   Labs & Imaging Results for orders placed during the hospital encounter of 03/27/12 (from the past 48 hour(s))  CBC     Status: Abnormal   Collection Time    03/27/12  6:40 PM      Result Value Range   WBC 22.5 (*) 4.0 - 10.5 K/uL   RBC 5.73  4.22 - 5.81 MIL/uL   Hemoglobin 16.4  13.0 - 17.0 g/dL   HCT 16.1  09.6 - 04.5 %   MCV 84.1  78.0 - 100.0 fL   MCH 28.6  26.0 - 34.0 pg   MCHC 34.0  30.0 - 36.0 g/dL   RDW 40.9  81.1 - 91.4 %   Platelets 373  150 - 400 K/uL  COMPREHENSIVE METABOLIC PANEL     Status: Abnormal   Collection Time    03/27/12  6:40 PM      Result Value Range   Sodium 135  135 - 145 mEq/L   Potassium 4.0  3.5 - 5.1 mEq/L   Chloride 97  96 - 112 mEq/L   CO2 26  19 - 32 mEq/L   Glucose, Bld 104 (*) 70 - 99 mg/dL   BUN 22  6 - 23 mg/dL   Creatinine, Ser 7.82  0.50 - 1.35 mg/dL   Calcium 9.2  8.4 - 95.6 mg/dL   Total Protein 7.7  6.0 - 8.3 g/dL   Albumin 3.8  3.5 - 5.2 g/dL   AST 44 (*) 0 - 37 U/L   ALT 31  0 - 53 U/L   Alkaline Phosphatase 76  39 - 117 U/L   Total Bilirubin 0.4  0.3 - 1.2 mg/dL   GFR calc non Af Amer 80 (*) >90 mL/min   GFR calc Af Amer >90  >90 mL/min   Comment:            The eGFR has been calculated     using the CKD EPI equation.     This calculation has not been     validated in all clinical     situations.     eGFR's persistently     <90 mL/min signify     possible Chronic Kidney Disease.  LIPASE, BLOOD     Status: None   Collection Time    03/27/12  6:40 PM      Result Value Range   Lipase 31  11 - 59 U/L  URINALYSIS, ROUTINE W REFLEX MICROSCOPIC     Status: Abnormal   Collection Time    03/27/12  7:51 PM      Result Value Range   Color, Urine YELLOW  YELLOW   APPearance CLEAR  CLEAR   Specific Gravity, Urine 1.035 (*) 1.005 -  1.030    pH 5.0  5.0 - 8.0   Glucose, UA NEGATIVE  NEGATIVE mg/dL   Hgb urine dipstick NEGATIVE  NEGATIVE   Bilirubin Urine NEGATIVE  NEGATIVE   Ketones, ur NEGATIVE  NEGATIVE mg/dL   Protein, ur NEGATIVE  NEGATIVE mg/dL   Urobilinogen, UA 0.2  0.0 - 1.0 mg/dL   Nitrite NEGATIVE  NEGATIVE   Leukocytes, UA NEGATIVE  NEGATIVE   Comment: MICROSCOPIC NOT DONE ON URINES WITH NEGATIVE PROTEIN, BLOOD, LEUKOCYTES, NITRITE, OR GLUCOSE <1000 mg/dL.  LACTIC ACID, PLASMA     Status: None   Collection Time    03/27/12 11:15 PM      Result Value Range   Lactic Acid, Venous 1.1  0.5 - 2.2 mmol/L    Cardiac Enzymes: No results found for this basename: CKTOTAL, CKMB, CKMBINDEX, TROPONINI,  in the last 168 hours  BNP (last 3 results) No results found for this basename: PROBNP,  in the last 8760 hours CBG: No results found for this basename: GLUCAP,  in the last 168 hours  Radiological Exams on Admission: Dg Chest 2 View  03/27/2012  *RADIOLOGY REPORT*  Clinical Data: 42 year old male kidney with fever nausea vomiting diarrhea abdominal pain Crohn disease.  CHEST - 2 VIEW  Comparison: 09/17/2006.  Findings: Lower lung volumes.  Cardiac size at the upper limits of normal, stable. Other mediastinal contours are within normal limits.  Visualized tracheal air column is within normal limits. No pneumoperitoneum or pneumothorax.  Oral contrast in the stomach. No pulmonary edema, pleural effusion or confluent pulmonary opacity. No acute osseous abnormality identified.  IMPRESSION: Low lung volumes, otherwise no acute cardiopulmonary abnormality.   Original Report Authenticated By: Erskine Speed, M.D.    Ct Abdomen Pelvis W Contrast  03/27/2012  *RADIOLOGY REPORT*  Clinical Data: Right lower quadrant pain.  History Crohn's disease. Two prior bowel resections.  CT ABDOMEN AND PELVIS WITH CONTRAST  Technique:  Multidetector CT imaging of the abdomen and pelvis was performed following the standard protocol during bolus  administration of intravenous contrast.  Contrast: 50mL OMNIPAQUE IOHEXOL 300 MG/ML  SOLN, OMNIPAQUE IOHEXOL 300 MG/ML  SOLN  Comparison: 10/16/2006.  Findings: Prior resection of bowel with ileal colonic anastomoses. Minimal stranding of fat planes adjacent to anastomose  may represent scarring from prior inflammation.  The distal small bowel loop entering the anastomoses has slightly thickened wall.  Mild inflammation not excluded. No proximal obstruction.  No free intraperitoneal air, abnormal fluid collection or discrete fistula identified.  No focal hepatic, splenic, pancreatic, adrenal or renal lesion.  No calcified gallstones.  Advanced atherosclerotic type changes with calcification at the aortic bifurcation.  Noncontrast filled views urinary bladder unremarkable.  L5 bilateral pars defects.  Lung bases clear.  IMPRESSION:  Prior resection of bowel with ileal colonic anastomoses.  Minimal stranding of fat planes adjacent to anastomose  may represent scarring from prior inflammation.  The distal small bowel loop entering the anastomoses has slightly thickened wall.  Mild inflammation not excluded. No proximal obstruction.  No free intraperitoneal air, abnormal fluid collection or discrete fistula identified.  Atherosclerotic type changes aortic bifurcation.  L5 bilateral pars defects.   Original Report Authenticated By: Lacy Duverney, M.D.       Assessment/Plan: Principal Problem:   Exacerbation of Crohn's disease Active Problems:   Nausea and vomiting   Tachycardia   Obesity, Class III, BMI 40-49.9 (morbid obesity)   Hypertriglyceridemia   Leukocytosis-  s  1.  Crohn's Exacerbation-  PLaced  on IV asteroid taper, and IV cipro and flagyl, along with supportive care.    2.  Nausea and Vomiting-   Anit-emetics PRN,  IVFs for hydration.    3.   Tachycardia- due to Pain, and dehydration, remedy with Fluid resuscitation and Pain control.    4.   Leukocytosis-  Most likely a Stress  Leukocytosis from Pain.  Monitor.     4.   Hypertriglyceridemia-   Check Lips.  5.  Obesity - stable.     6.  Other- DVT prophylaxis ordered.       Code Status:  FULL CODE   Family Communication:     No Family Present  Disposition Plan:  Return to Home on Discharge  Time spent:  18 Minutes  Ron Parker [facility] Pager [phone]  If 7PM-7AM, please contact night-coverage [email or web address] Password [facility] 03/28/2012, 5:16 AM

## 2012-03-28 NOTE — Progress Notes (Signed)
Pt has refused CPAP at this time, Pt unsure if he will need it tonight due to possible impending discharge, RT will pass along to dayshift RT in order re-evaluate patients need for CPAP tonight.  RT to monitor and assess as needed.

## 2012-03-28 NOTE — Progress Notes (Signed)
Spoke with pt in regards to nighttime CPAP, pt states wears a FFM and only uses humidification every now and then, not interested in using tonight. Placed pt on auto titration with pressure settings: max 20cmh20 min 5cmh20. Pt tolerating well at this time. RT will assist as needed.

## 2012-03-29 ENCOUNTER — Encounter (HOSPITAL_COMMUNITY): Payer: Self-pay

## 2012-03-29 LAB — CBC
HCT: 43.2 % (ref 39.0–52.0)
RBC: 5.11 MIL/uL (ref 4.22–5.81)
RDW: 13.8 % (ref 11.5–15.5)
WBC: 18.2 10*3/uL — ABNORMAL HIGH (ref 4.0–10.5)

## 2012-03-29 NOTE — Progress Notes (Signed)
Pt states that he will place CPAP mask when he is ready for bed.

## 2012-03-29 NOTE — Progress Notes (Signed)
[facility] PROGRESS NOTE  [name] ZOX:[id number] DOB: [birth year] DOA: 03/27/2012 [name], MD  Brief Narrative:  42 y/o male with hx of crohn's disease presented with RLQ pain with fever and leucocytosis and CT suggestive of crohn's flare.   Assessment/Plan:  crohn's flare up:  continue  pain medications, IV solumedrol, tylenol and antiemetics . Solu Medrol Does adjusted by GI Seen by Dr Marina Goodell in past and has had colectomy done for SBO. Last flare up 5 years back. ot on any mediations for it currently  Appreciate GI recommendations. Recommend to continue antibiotics and steroids for now. Advance diet to full liquids. Noted for increased white count which likely in the setting of steroid use. Plan to switch steroid to by mouth and advance diet to regular tomorrow if tolerated. If improved will discharge him home with outpatient colonoscopy  OSA  continue CPAP as toelrated   Code Status: full code  Family Communication:none at bedside  Disposition Plan: Home likely tomorrow if improved  Consultants:  [facility] Procedures:  none   Antibiotics: IV Cipro and Flagyl HPI/Subjective: Feels his abdominal pain to be better today.  Objective: Filed Vitals:   03/28/12 1339 03/28/12 2152 03/29/12 0235 03/29/12 0627  BP: 120/73 116/75 111/64 98/62  Pulse: 98 96 90 80  Temp: 97.6 F (36.4 C) 97.4 F (36.3 C) 97.8 F (36.6 C) 97.7 F (36.5 C)  TempSrc: Oral Oral Oral Oral  Resp: 18 20 22 20   Height:      Weight:      SpO2: 93% 95% 94% 94%    Intake/Output Summary (Last 24 hours) at 03/29/12 1334 Last data filed at 03/28/12 1400  Gross per 24 hour  Intake 1387.5 ml  Output      0 ml  Net 1387.5 ml   Filed Weights   03/28/12 0206  Weight: 140.4 kg (309 lb 8.4 oz)    Exam:  General: Middle aged male in NAD  HEENT: no pallor, moist oral mucosa  Cardiovascular: NS1&S2, no murmurs  Respiratory: clear to auscultation b/l  Abdomen: soft, mild  RLQ tenderness (much improved from yesterday), ND, BS+  Ext: warm, no edema  CNS: AAOX 3   Data Reviewed: Basic Metabolic Panel:  Recent Labs Lab 03/27/12 1840 03/28/12 0536  NA 135 134*  K 4.0 3.9  CL 97 100  CO2 26 23  GLUCOSE 104* 138*  BUN 22 18  CREATININE 1.13 0.99  CALCIUM 9.2 8.0*   Liver Function Tests:  Recent Labs Lab 03/27/12 1840  AST 44*  ALT 31  ALKPHOS 76  BILITOT 0.4  PROT 7.7  ALBUMIN 3.8    Recent Labs Lab 03/27/12 1840  LIPASE 31   No results found for this basename: AMMONIA,  in the last 168 hours CBC:  Recent Labs Lab 03/27/12 1840 03/28/12 0536 03/29/12 0455  WBC 22.5* 14.4* 18.2*  HGB 16.4 14.4 14.4  HCT 48.2 43.6 43.2  MCV 84.1 85.0 84.5  PLT 373 344 391   Cardiac Enzymes: No results found for this basename: CKTOTAL, CKMB, CKMBINDEX, TROPONINI,  in the last 168 hours BNP (last 3 results) No results found for this basename: PROBNP,  in the last 8760 hours CBG: No results found for this basename: GLUCAP,  in the last 168 hours  Recent Results (from the past 240 hour(s))  CULTURE, BLOOD (ROUTINE X 2)     Status: None   Collection Time    03/27/12 11:10 PM  Result Value Range Status   Specimen Description BLOOD LH   Final   Special Requests NONE BOTTLES DRAWN AEROBIC AND ANAEROBIC 4CC   Final   Culture  Setup Time 03/28/2012 06:24   Final   Culture     Final   Value:        BLOOD CULTURE RECEIVED NO GROWTH TO DATE CULTURE WILL BE HELD FOR 5 DAYS BEFORE ISSUING A FINAL NEGATIVE REPORT   Report Status PENDING   Incomplete  CULTURE, BLOOD (ROUTINE X 2)     Status: None   Collection Time    03/27/12 11:15 PM      Result Value Range Status   Specimen Description BLOOD LA   Final   Special Requests NONE BOTTLES DRAWN AEROBIC AND ANAEROBIC 3CC   Final   Culture  Setup Time 03/28/2012 06:24   Final   Culture     Final   Value:        BLOOD CULTURE RECEIVED NO GROWTH TO DATE CULTURE WILL BE HELD FOR 5 DAYS BEFORE ISSUING A  FINAL NEGATIVE REPORT   Report Status PENDING   Incomplete     Studies: Dg Chest 2 View  03/27/2012  *RADIOLOGY REPORT*  Clinical Data: 42 year old male kidney with fever nausea vomiting diarrhea abdominal pain Crohn disease.  CHEST - 2 VIEW  Comparison: 09/17/2006.  Findings: Lower lung volumes.  Cardiac size at the upper limits of normal, stable. Other mediastinal contours are within normal limits.  Visualized tracheal air column is within normal limits. No pneumoperitoneum or pneumothorax.  Oral contrast in the stomach. No pulmonary edema, pleural effusion or confluent pulmonary opacity. No acute osseous abnormality identified.  IMPRESSION: Low lung volumes, otherwise no acute cardiopulmonary abnormality.   Original Report Authenticated By: Erskine Speed, M.D.    Ct Abdomen Pelvis W Contrast  03/27/2012  *RADIOLOGY REPORT*  Clinical Data: Right lower quadrant pain.  History Crohn's disease. Two prior bowel resections.  CT ABDOMEN AND PELVIS WITH CONTRAST  Technique:  Multidetector CT imaging of the abdomen and pelvis was performed following the standard protocol during bolus administration of intravenous contrast.  Contrast: 50mL OMNIPAQUE IOHEXOL 300 MG/ML  SOLN, OMNIPAQUE IOHEXOL 300 MG/ML  SOLN  Comparison: 10/16/2006.  Findings: Prior resection of bowel with ileal colonic anastomoses. Minimal stranding of fat planes adjacent to anastomose  may represent scarring from prior inflammation.  The distal small bowel loop entering the anastomoses has slightly thickened wall.  Mild inflammation not excluded. No proximal obstruction.  No free intraperitoneal air, abnormal fluid collection or discrete fistula identified.  No focal hepatic, splenic, pancreatic, adrenal or renal lesion.  No calcified gallstones.  Advanced atherosclerotic type changes with calcification at the aortic bifurcation.  Noncontrast filled views urinary bladder unremarkable.  L5 bilateral pars defects.  Lung bases clear.  IMPRESSION:   Prior resection of bowel with ileal colonic anastomoses.  Minimal stranding of fat planes adjacent to anastomose  may represent scarring from prior inflammation.  The distal small bowel loop entering the anastomoses has slightly thickened wall.  Mild inflammation not excluded. No proximal obstruction.  No free intraperitoneal air, abnormal fluid collection or discrete fistula identified.  Atherosclerotic type changes aortic bifurcation.  L5 bilateral pars defects.   Original Report Authenticated By: Lacy Duverney, M.D.     Scheduled Meds: . ciprofloxacin  400 mg Intravenous Q12H  . enoxaparin (LOVENOX) injection  70 mg Subcutaneous Q24H  . methylPREDNISolone (SOLU-MEDROL) injection  20 mg Intravenous Q8H  .  metronidazole  500 mg Intravenous Q8H  . pantoprazole (PROTONIX) IV  40 mg Intravenous Q24H  . sodium chloride  3 mL Intravenous Q12H   Continuous Infusions:     Time spent: 25 minutes    [name], [name]  [facility] Pager [phone] If 8PM-8AM, please contact night-coverage at [email or web address], password [email or web address] 03/29/2012, 1:34 PM  LOS: 2 days

## 2012-03-29 NOTE — Progress Notes (Signed)
[facility] Progress Note  SUBJECTIVE: feels okay, abdominal pain comes and goes. No nausea. Having some BMs  OBJECTIVE:  Vital signs in last 24 hours: Temp:  [97.4 F (36.3 C)-97.8 F (36.6 C)] 97.7 F (36.5 C) (03/03 0627) Pulse Rate:  [80-98] 80 (03/03 0627) Resp:  [18-22] 20 (03/03 0627) BP: (98-120)/(62-75) 98/62 mmHg (03/03 0627) SpO2:  [93 %-95 %] 94 % (03/03 0627) Last BM Date: 03/28/12 General:   pleaseant obese white male in NAD Heart:  Regular rate and rhythm Lungs: Respirations even and unlabored, lungs CTA bilaterally Abdomen:  Soft, nontender and nondistended. Normal bowel sounds. Extremities:  Without edema. Neurologic:  Alert and oriented,  grossly normal neurologically. Psych:  Cooperative. Normal mood and affect.      Lab Results:  Recent Labs  03/27/12 1840 03/28/12 0536 03/29/12 0455  WBC 22.5* 14.4* 18.2*  HGB 16.4 14.4 14.4  HCT 48.2 43.6 43.2  PLT 373 344 391   BMET  Recent Labs  03/27/12 1840 03/28/12 0536  NA 135 134*  K 4.0 3.9  CL 97 100  CO2 26 23  GLUCOSE 104* 138*  BUN 22 18  CREATININE 1.13 0.99  CALCIUM 9.2 8.0*   LFT  Recent Labs  03/27/12 1840  PROT 7.7  ALBUMIN 3.8  AST 44*  ALT 31  ALKPHOS 76  BILITOT 0.4   Studies/Results: Dg Chest 2 View  03/27/2012  *RADIOLOGY REPORT*  Clinical Data: 42 year old male kidney with fever nausea vomiting diarrhea abdominal pain Crohn disease.  CHEST - 2 VIEW  Comparison: 09/17/2006.  Findings: Lower lung volumes.  Cardiac size at the upper limits of normal, stable. Other mediastinal contours are within normal limits.  Visualized tracheal air column is within normal limits. No pneumoperitoneum or pneumothorax.  Oral contrast in the stomach. No pulmonary edema, pleural effusion or confluent pulmonary opacity. No acute osseous abnormality identified.  IMPRESSION: Low lung volumes, otherwise no acute cardiopulmonary abnormality.   Original Report Authenticated By: Erskine Speed, M.D.    Ct Abdomen Pelvis W Contrast  03/27/2012  *RADIOLOGY REPORT*  Clinical Data: Right lower quadrant pain.  History Crohn's disease. Two prior bowel resections.  CT ABDOMEN AND PELVIS WITH CONTRAST  Technique:  Multidetector CT imaging of the abdomen and pelvis was performed following the standard protocol during bolus administration of intravenous contrast.  Contrast: 50mL OMNIPAQUE IOHEXOL 300 MG/ML  SOLN, OMNIPAQUE IOHEXOL 300 MG/ML  SOLN  Comparison: 10/16/2006.  Findings: Prior resection of bowel with ileal colonic anastomoses. Minimal stranding of fat planes adjacent to anastomose  may represent scarring from prior inflammation.  The distal small bowel loop entering the anastomoses has slightly thickened wall.  Mild inflammation not excluded. No proximal obstruction.  No free intraperitoneal air, abnormal fluid collection or discrete fistula identified.  No focal hepatic, splenic, pancreatic, adrenal or renal lesion.  No calcified gallstones.  Advanced atherosclerotic type changes with calcification at the aortic bifurcation.  Noncontrast filled views urinary bladder unremarkable.  L5 bilateral pars defects.  Lung bases clear.  IMPRESSION:  Prior resection of bowel with ileal colonic anastomoses.  Minimal stranding of fat planes adjacent to anastomose  may represent scarring from prior inflammation.  The distal small bowel loop entering the anastomoses has slightly thickened wall.  Mild inflammation not excluded. No proximal obstruction.  No free intraperitoneal air, abnormal fluid collection or discrete fistula identified.  Atherosclerotic type changes aortic bifurcation.  L5 bilateral pars defects.   Original Report Authenticated By: Lacy Duverney, M.D.  ASSESSMENT / PLAN:  Longstanding Crohn's ileitis, s/p ileocecectomy age 89 and ileocolonic resection with anastomosis 2008 (Dr. Michaell Cowing). Patient didn't follow up with GI after surgery. 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 Symptoms improving on IV steroids but still having some intermittent pain. Will advance diet to full liquids. He is on Flagyl and Cipro. Tomorrow we can transition him to oral steroids, oral pain medications. Patient will need outpatient colonoscopy with Dr. Marina Goodell.     LOS: 2 days   Willette Cluster  03/29/2012, 9:30 AM  I have personally taken an interval history, reviewed the chart, and examined the patient.  I agree with the extender's note, impression and recommendations.  Barbette Hair. Arlyce Dice, MD, [facility] [phone]

## 2012-03-30 ENCOUNTER — Encounter: Payer: Self-pay | Admitting: Internal Medicine

## 2012-03-30 MED ORDER — METRONIDAZOLE 500 MG PO TABS: 500.0000 mg | ORAL_TABLET | Freq: Three times a day (TID) | ORAL | Status: DC

## 2012-03-30 MED ORDER — OXYCODONE HCL 5 MG PO TABS: 5.0000 mg | ORAL_TABLET | Freq: Four times a day (QID) | ORAL | Status: DC | PRN

## 2012-03-30 MED ORDER — PREDNISONE 20 MG PO TABS: ORAL_TABLET | ORAL | Status: DC

## 2012-03-30 MED ORDER — CIPROFLOXACIN HCL 500 MG PO TABS: 500.0000 mg | ORAL_TABLET | Freq: Two times a day (BID) | ORAL | Status: DC

## 2012-03-30 NOTE — Discharge Summary (Signed)
Physician Discharge Summary  [name] ZOX:[id number] DOB: [birth year] DOA: 03/27/2012  [name], MD  Admit date: 03/27/2012 Discharge date: 03/30/2012  Time spent: 40 minutes  Recommendations for Outpatient Follow-up:  Home with out pt follow up with [facility]  Discharge Diagnoses:   Principal Problem:   Exacerbation of Crohn's disease  Active Problems:   Obesity, Class III, BMI 40-49.9 (morbid obesity)   Hypertriglyceridemia   Nausea and vomiting   Discharge Condition: fair  Diet recommendation: regular  Filed Weights   03/28/12 0206  Weight: 140.4 kg (309 lb 8.4 oz)    History of present illness:  Please refer to admission H&P for details but in brief, 42 y/o male with hx of crohn's disease presented with RLQ pain with fever and leucocytosis and CT suggestive of crohn's flare.  Hospital Course:   crohn's flare up:  Patient given pain medications, IV solumedrol, tylenol and antiemetics . Solu Medrol Does adjusted by GI  Seen by Dr Marina Goodell in past and has had colectomy done for SBO. Last flare up 5 years back. not on any mediations for it currently and seems to have issues of non compliance with follow up.  Appreciate GI recommendations. Recommend to continue antibiotics and steroids. Advance diet to regular. Will discharge on total 5 day course of abx and po prednisone until seen by GI as outpatient. He will be called from  The office for appt.   OSA  continue CPAP   Obesity  counseled on weight reduction, diet adherence and regular exercise   Code Status: full code   Consultants:  [facility]   Procedures:  none     Antibiotics:  Cipro and Flagyl    Discharge Exam: Filed Vitals:   03/29/12 1445 03/29/12 2120 03/30/12 0632 03/30/12 0956  BP: 132/71 124/73 101/67 128/79  Pulse: 79 81 82 85  Temp: 97.6 F (36.4 C) 97.7 F (36.5 C) 97.7 F (36.5 C) 98.4 F (36.9 C)  TempSrc: Oral Oral Oral Oral  Resp: 20 20 20 20   Height:       Weight:      SpO2: 93% 94% 98% 95%    General: Middle aged male in NAD  HEENT: no pallor, moist oral mucosa  Cardiovascular: NS1&S2, no murmurs  Respiratory: clear to auscultation b/l  Abdomen: soft, mild RLQ tenderness (much improved ), ND, BS+  Ext: warm, no edema  CNS: AAOX 3   Discharge Instructions   Future Appointments Provider Department Dept Phone   06/14/2012 1:00 PM Sheliah Hatch, MD [facility] at  [place] [phone]       Medication List    TAKE these medications       ciprofloxacin 500 MG tablet  Commonly known as:  CIPRO  Take 1 tablet (500 mg total) by mouth 2 (two) times daily.     fenofibrate 160 MG tablet  Take 1 tablet (160 mg total) by mouth daily.     fish oil-omega-3 fatty acids 1000 MG capsule  Take 1 g by mouth daily.     GREEN TEA PO  Take 1 tablet by mouth daily.     metroNIDAZOLE 500 MG tablet  Commonly known as:  FLAGYL  Take 1 tablet (500 mg total) by mouth 3 (three) times daily.     MILK THISTLE PO  Take 1 tablet by mouth daily.     multivitamin with minerals Tabs  Take 3 tablets by mouth daily.     oxyCODONE 5 MG immediate  release tablet  Commonly known as:  Oxy IR/ROXICODONE  Take 1 tablet (5 mg total) by mouth every 6 (six) hours as needed.     predniSONE 20 MG tablet  Commonly known as:  DELTASONE  Take 2 tablets daily     vitamin C 500 MG tablet  Commonly known as:  ASCORBIC ACID  Take 500 mg by mouth daily.           Follow-up Information   Follow up with Yancey Flemings, MD. (will be called from the office for appt)    Contact information:   [address] [place] [address] [phone]        The results of significant diagnostics from this hospitalization (including imaging, microbiology, ancillary and laboratory) are listed below for reference.    Significant Diagnostic Studies: Dg Chest 2 View  03/27/2012  *RADIOLOGY REPORT*  Clinical Data: 42 year old male kidney with  fever nausea vomiting diarrhea abdominal pain Crohn disease.  CHEST - 2 VIEW  Comparison: 09/17/2006.  Findings: Lower lung volumes.  Cardiac size at the upper limits of normal, stable. Other mediastinal contours are within normal limits.  Visualized tracheal air column is within normal limits. No pneumoperitoneum or pneumothorax.  Oral contrast in the stomach. No pulmonary edema, pleural effusion or confluent pulmonary opacity. No acute osseous abnormality identified.  IMPRESSION: Low lung volumes, otherwise no acute cardiopulmonary abnormality.   Original Report Authenticated By: Erskine Speed, M.D.    Ct Abdomen Pelvis W Contrast  03/27/2012  *RADIOLOGY REPORT*  Clinical Data: Right lower quadrant pain.  History Crohn's disease. Two prior bowel resections.  CT ABDOMEN AND PELVIS WITH CONTRAST  Technique:  Multidetector CT imaging of the abdomen and pelvis was performed following the standard protocol during bolus administration of intravenous contrast.  Contrast: 50mL OMNIPAQUE IOHEXOL 300 MG/ML  SOLN, OMNIPAQUE IOHEXOL 300 MG/ML  SOLN  Comparison: 10/16/2006.  Findings: Prior resection of bowel with ileal colonic anastomoses. Minimal stranding of fat planes adjacent to anastomose  may represent scarring from prior inflammation.  The distal small bowel loop entering the anastomoses has slightly thickened wall.  Mild inflammation not excluded. No proximal obstruction.  No free intraperitoneal air, abnormal fluid collection or discrete fistula identified.  No focal hepatic, splenic, pancreatic, adrenal or renal lesion.  No calcified gallstones.  Advanced atherosclerotic type changes with calcification at the aortic bifurcation.  Noncontrast filled views urinary bladder unremarkable.  L5 bilateral pars defects.  Lung bases clear.  IMPRESSION:  Prior resection of bowel with ileal colonic anastomoses.  Minimal stranding of fat planes adjacent to anastomose  may represent scarring from prior inflammation.  The  distal small bowel loop entering the anastomoses has slightly thickened wall.  Mild inflammation not excluded. No proximal obstruction.  No free intraperitoneal air, abnormal fluid collection or discrete fistula identified.  Atherosclerotic type changes aortic bifurcation.  L5 bilateral pars defects.   Original Report Authenticated By: Lacy Duverney, M.D.     Microbiology: Recent Results (from the past 240 hour(s))  CULTURE, BLOOD (ROUTINE X 2)     Status: None   Collection Time    03/27/12 11:10 PM      Result Value Range Status   Specimen Description BLOOD LH   Final   Special Requests NONE BOTTLES DRAWN AEROBIC AND ANAEROBIC 4CC   Final   Culture  Setup Time 03/28/2012 06:24   Final   Culture     Final   Value:  BLOOD CULTURE RECEIVED NO GROWTH TO DATE CULTURE WILL BE HELD FOR 5 DAYS BEFORE ISSUING A FINAL NEGATIVE REPORT   Report Status PENDING   Incomplete  CULTURE, BLOOD (ROUTINE X 2)     Status: None   Collection Time    03/27/12 11:15 PM      Result Value Range Status   Specimen Description BLOOD LA   Final   Special Requests NONE BOTTLES DRAWN AEROBIC AND ANAEROBIC 3CC   Final   Culture  Setup Time 03/28/2012 06:24   Final   Culture     Final   Value:        BLOOD CULTURE RECEIVED NO GROWTH TO DATE CULTURE WILL BE HELD FOR 5 DAYS BEFORE ISSUING A FINAL NEGATIVE REPORT   Report Status PENDING   Incomplete     Labs: Basic Metabolic Panel:  Recent Labs Lab 03/27/12 1840 03/28/12 0536  NA 135 134*  K 4.0 3.9  CL 97 100  CO2 26 23  GLUCOSE 104* 138*  BUN 22 18  CREATININE 1.13 0.99  CALCIUM 9.2 8.0*   Liver Function Tests:  Recent Labs Lab 03/27/12 1840  AST 44*  ALT 31  ALKPHOS 76  BILITOT 0.4  PROT 7.7  ALBUMIN 3.8    Recent Labs Lab 03/27/12 1840  LIPASE 31   No results found for this basename: AMMONIA,  in the last 168 hours CBC:  Recent Labs Lab 03/27/12 1840 03/28/12 0536 03/29/12 0455  WBC 22.5* 14.4* 18.2*  HGB 16.4 14.4 14.4   HCT 48.2 43.6 43.2  MCV 84.1 85.0 84.5  PLT 373 344 391   Cardiac Enzymes: No results found for this basename: CKTOTAL, CKMB, CKMBINDEX, TROPONINI,  in the last 168 hours BNP: BNP (last 3 results) No results found for this basename: PROBNP,  in the last 8760 hours CBG: No results found for this basename: GLUCAP,  in the last 168 hours     Signed:  [name], [name]  [facility] 03/30/2012, 11:52 AM

## 2012-03-30 NOTE — Progress Notes (Signed)
[facility] Progress Note  SUBJECTIVE: feels okay, pain about a 4/10. Moving bowels. Tolerating liquids  OBJECTIVE:  Vital signs in last 24 hours: Temp:  [97.6 F (36.4 C)-97.7 F (36.5 C)] 97.7 F (36.5 C) (03/04 1191) Pulse Rate:  [79-82] 82 (03/04 0632) Resp:  [20] 20 (03/04 4782) BP: (101-132)/(67-73) 101/67 mmHg (03/04 0632) SpO2:  [93 %-98 %] 98 % (03/04 9562) Last BM Date: 03/29/12 General:    white male in NAD Heart:  Regular rate and rhythm Abdomen:  Soft, nontender and nondistended. Normal bowel sounds. Extremities:  Without edema. Neurologic:  Alert and oriented,  grossly normal neurologically. Psych:  Cooperative. Normal mood and affect.     Lab Results:  Recent Labs  03/27/12 1840 03/28/12 0536 03/29/12 0455  WBC 22.5* 14.4* 18.2*  HGB 16.4 14.4 14.4  HCT 48.2 43.6 43.2  PLT 373 344 391   BMET  Recent Labs  03/27/12 1840 03/28/12 0536  NA 135 134*  K 4.0 3.9  CL 97 100  CO2 26 23  GLUCOSE 104* 138*  BUN 22 18  CREATININE 1.13 0.99  CALCIUM 9.2 8.0*   LFT  Recent Labs  03/27/12 1840  PROT 7.7  ALBUMIN 3.8  AST 44*  ALT 31  ALKPHOS 76  BILITOT 0.4   ASSESSMENT / PLAN:  Longstanding Crohn's ileitis, s/p ileocecectomy age 42 and ileocolonic resection with anastomosis 2008 . Now admitted with recurrent partial SBO possibly combination of stricture and inflammation. Symptoms improving on IV steroids. His diet has been advanced to solids. If tolerates solids then can go home today from GI standpoint. He will need to stay on 40mg  of Prednisone until seen by Dr. Marina Goodell (will make him an appt).     LOS: 3 days   Willette Cluster  03/30/2012, 8:39 AM

## 2012-04-03 LAB — CULTURE, BLOOD (ROUTINE X 2): Culture: NO GROWTH

## 2012-04-12 ENCOUNTER — Encounter: Payer: Self-pay | Admitting: Internal Medicine

## 2012-04-12 ENCOUNTER — Ambulatory Visit (INDEPENDENT_AMBULATORY_CARE_PROVIDER_SITE_OTHER): Payer: BC Managed Care – PPO | Admitting: Internal Medicine

## 2012-04-12 VITALS — BP 104/76 | HR 84 | Ht 69.5 in | Wt 300.5 lb

## 2012-04-12 DIAGNOSIS — K5090 Crohn's disease, unspecified, without complications: Secondary | ICD-10-CM

## 2012-04-12 DIAGNOSIS — R933 Abnormal findings on diagnostic imaging of other parts of digestive tract: Secondary | ICD-10-CM

## 2012-04-12 DIAGNOSIS — R109 Unspecified abdominal pain: Secondary | ICD-10-CM

## 2012-04-12 MED ORDER — MOVIPREP 100 G PO SOLR: 1.0000 | Freq: Once | ORAL | Status: DC

## 2012-04-12 MED ORDER — PREDNISONE 20 MG PO TABS: ORAL_TABLET | ORAL | Status: DC

## 2012-04-12 NOTE — Progress Notes (Signed)
HISTORY OF PRESENT ILLNESS:  [name] is a 42 y.o. 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 He has had recurrent small bowel obstruction due to a combination of fixed stenotic disease as well as inflammatory disease. He has a history of medical noncompliance. He was last seen in June of 2008 after a flare of disease. He was on 6-mercaptopurine and tapering prednisone. The plan was to increase prednisone, check thiopurine metabolites, and followup in 6 weeks. We also discussed biologic agents and provide him with literature. He failed to followup. At some point he came off of his maintenance medications and resurfaced March 1 20 presented to the emergency room with acute abdominal pain. Laboratories revealed a white blood cell count of 22.5. Other laboratories unremarkable. CT scan of the abdomen and pelvis was performed and revealed minimal stranding of fat planes adjacent to the surgical anastomosis with slight thickening of the small bowel just proximal. No obstruction or other abnormalities. He was treated with antibiotics and prednisone. He was discharged home on March 4. Currently on prednisone 40 mg daily. He did miss work on March 12 with upset stomach. He states he is feeling well at this time with no complaints. He describes normal bowel habits without bleeding. No abdominal pain. No fever.Marland Kitchen His last colonoscopy was performed March 2006. He was found to have stricturing in the anastomotic site.  REVIEW OF SYSTEMS:  All non-GI ROS negative after complete review   Past Medical History  Diagnosis Date  . Crohn disease   . Hypertriglyceridemia   . Low testosterone   . OSA on CPAP     Past Surgical History  Procedure Laterality Date  . Colon surgery      Social History [name]  reports that he quit smoking about 5 months ago. He has never used smokeless tobacco. He reports that  drinks alcohol. He reports that he does not use illicit  drugs.  family history includes Arthritis in his mother; Cancer in his mother; and Diabetes in his mother.  No Known Allergies     PHYSICAL EXAMINATION: Vital signs: BP 104/76  Pulse 84  Ht 5' 9.5" (1.765 m)  Wt 300 lb 8 oz (136.306 kg)  BMI 43.75 kg/m2  Constitutional: generally well-appearing, no acute distress Psychiatric: alert and oriented x3, cooperative Eyes: extraocular movements intact, anicteric, conjunctiva pink Mouth: oral pharynx moist, no lesions Neck: supple no lymphadenopathy Cardiovascular: heart regular rate and rhythm, no murmur Lungs: clear to auscultation bilaterally Abdomen: soft, nontender, nondistended, no obvious ascites, no peritoneal signs, normal bowel sounds, no organomegaly. Prior surgical incision well-healed Rectal: Deferred until colonoscopy Extremities: no lower extremity edema bilaterally Skin: no lesions on visible extremities Neuro: No focal deficits. No asterixis.    ASSESSMENT:  #1. Small bowel Crohn status post ileocecectomy remotely. #2. Recent hospitalization with probable flare of Crohn's. Feeling better on prednisone #3. History of medical noncompliance   PLAN:  #1. Continue prednisone 40 mg daily for an additional 2 weeks and taper by 10 mg every 2 weeks until off. #2. Schedule colonoscopy to assess Crohn's disease.The nature of the procedure, as well as the risks, benefits, and alternatives were carefully and thoroughly reviewed with the patient. Ample time for discussion and questions allowed. The patient understood, was satisfied, and agreed to proceed. Movi prep prescribed. The patient instructed on its use #3. Work note (excused absence) as requested #4. Office followup after colonoscopy results known. We will discuss the role of  Crohn's therapy moving forward

## 2012-04-12 NOTE — Patient Instructions (Addendum)
You have been scheduled for a colonoscopy with propofol. Please follow written instructions given to you at your visit today.  Please pick up your prep kit at the pharmacy within the next 1-3 days. If you use inhalers (even only as needed), please bring them with you on the day of your procedure.  Continue Prednisone as follows:  40mg  daily for 2 weeks, then 30 mg daily for 2 weeks, then 20mg  daily for 2 weeks then 10mg  daily for 2 weeks then stop

## 2012-05-04 ENCOUNTER — Encounter: Payer: Self-pay | Admitting: Internal Medicine

## 2012-05-04 ENCOUNTER — Ambulatory Visit (AMBULATORY_SURGERY_CENTER): Payer: BC Managed Care – PPO | Admitting: Internal Medicine

## 2012-05-04 VITALS — BP 123/70 | HR 86 | Temp 97.6°F | Resp 27 | Ht 69.0 in | Wt 300.0 lb

## 2012-05-04 DIAGNOSIS — D126 Benign neoplasm of colon, unspecified: Secondary | ICD-10-CM

## 2012-05-04 DIAGNOSIS — Z1211 Encounter for screening for malignant neoplasm of colon: Secondary | ICD-10-CM

## 2012-05-04 DIAGNOSIS — K5090 Crohn's disease, unspecified, without complications: Secondary | ICD-10-CM

## 2012-05-04 MED ORDER — SODIUM CHLORIDE 0.9 % IV SOLN: 500.0000 mL | INTRAVENOUS | Status: DC

## 2012-05-04 NOTE — Patient Instructions (Addendum)
Handouts were given to your care partner on crohn's polyps and also information on HUMIRA .  Per Dr. Marina Goodell Continue taper dose of prednisone as previously directed.  Please call the office for a follow up appointment for a few weeks.  You may resume your current medications today also.  Please call if any questions or concerns.    YOU HAD AN ENDOSCOPIC PROCEDURE TODAY AT THE [facility]: Refer to the procedure report that was given to you for any specific questions about what was found during the examination.  If the procedure report does not answer your questions, please call your gastroenterologist to clarify.  If you requested that your care partner not be given the details of your procedure findings, then the procedure report has been included in a sealed envelope for you to review at your convenience later.  YOU SHOULD EXPECT: Some feelings of bloating in the abdomen. Passage of more gas than usual.  Walking can help get rid of the air that was put into your GI tract during the procedure and reduce the bloating. If you had a lower endoscopy (such as a colonoscopy or flexible sigmoidoscopy) you may notice spotting of blood in your stool or on the toilet paper. If you underwent a bowel prep for your procedure, then you may not have a normal bowel movement for a few days.  DIET: Your first meal following the procedure should be a light meal and then it is ok to progress to your normal diet.  A half-sandwich or bowl of soup is an example of a good first meal.  Heavy or fried foods are harder to digest and may make you feel nauseous or bloated.  Likewise meals heavy in dairy and vegetables can cause extra gas to form and this can also increase the bloating.  Drink plenty of fluids but you should avoid alcoholic beverages for 24 hours.  ACTIVITY: Your care partner should take you home directly after the procedure.  You should plan to take it easy, moving slowly for the rest of the day.  You can  resume normal activity the day after the procedure however you should NOT DRIVE or use heavy machinery for 24 hours (because of the sedation medicines used during the test).    SYMPTOMS TO REPORT IMMEDIATELY: A gastroenterologist can be reached at any hour.  During normal business hours, 8:30 AM to 5:00 PM Monday through Friday, call [phone].  After hours and on weekends, please call the GI answering service at [phone] who will take a message and have the physician on call contact you.   Following lower endoscopy (colonoscopy or flexible sigmoidoscopy):  Excessive amounts of blood in the stool  Significant tenderness or worsening of abdominal pains  Swelling of the abdomen that is new, acute  Fever of 100F or higher   FOLLOW UP: If any biopsies were taken you will be contacted by phone or by letter within the next 1-3 weeks.  Call your gastroenterologist if you have not heard about the biopsies in 3 weeks.  Our staff will call the home number listed on your records the next business day following your procedure to check on you and address any questions or concerns that you may have at that time regarding the information given to you following your procedure. This is a courtesy call and so if there is no answer at the home number and we have not heard from you through the emergency physician on call, we  will assume that you have returned to your regular daily activities without incident.  SIGNATURES/CONFIDENTIALITY: You and/or your care partner have signed paperwork which will be entered into your electronic medical record.  These signatures attest to the fact that that the information above on your After Visit Summary has been reviewed and is understood.  Full responsibility of the confidentiality of this discharge information lies with you and/or your care-partner.

## 2012-05-04 NOTE — Progress Notes (Signed)
No complaints noted in the recovery room. Maw   

## 2012-05-04 NOTE — Progress Notes (Signed)
Called to room to assist during endoscopic procedure.  Patient ID and intended procedure confirmed with present staff. Received instructions for my participation in the procedure from the performing physician.  

## 2012-05-04 NOTE — Op Note (Signed)
[facility] [address] [place], [address]   COLONOSCOPY PROCEDURE REPORT  PATIENT: [name], [name]  MR#: [id number] BIRTHDATE: Apr 30, 1970 , 41  yrs. old GENDER: Male ENDOSCOPIST: Roxy Cedar, MD REFERRED BY:.  Self / Office PROCEDURE DATE:  05/04/2012 PROCEDURE:   Colonoscopy with snare polypectomy    x1 ASA CLASS:   Class II INDICATIONS:a known history of Crohn's. Recent flare. Evaluate. Last exam March 2006. MEDICATIONS: MAC sedation, administered by CRNA and propofol (Diprivan) 300mg  IV  DESCRIPTION OF PROCEDURE:   After the risks benefits and alternatives of the procedure were thoroughly explained, informed consent was obtained.  A digital rectal exam revealed no abnormalities of the rectum.   The LB [id number]  endoscope was introduced through the anus and advanced to the surgical anastomosis. No adverse events experienced.   The quality of the prep was excellent, using MoviPrep  The instrument was then slowly withdrawn as the colon was fully examined.      COLON FINDINGS: The colonoscope was advanced to the level of the surgical anastomosis. The anastomosis was patent. Significant active Crohn's as manifested by ulceration was most prominent in the 5-10 cm proximal to the anastomosis. More proximal to this area appeared normal. The colonic mucosa revealed one isolated abscess ulceration in the proximal portion. Otherwise normal mucosa.A single adenomatous appearing polyp measuring 5 mm in size was found in the descending colon.  A polypectomy was performed with a cold snare.  The resection was complete and the polyp tissue was completely retrieved.  Retroflexed views revealed no abnormalities. The time to cecum=2 minutes 52 seconds.  Withdrawal time=9 minutes 23 seconds.  The scope was withdrawn and the procedure completed. COMPLICATIONS: There were no complications.  ENDOSCOPIC IMPRESSION: 1. Active ileal Crohn's immediately  proximal to the anastomosis 2. Focal Crohn's ulcer in the right colon. Otherwise normal colonic mucosa 3. Diminutive descending colon polyp removed with cold snare.  RECOMMENDATIONS: 1.  Continue prednisone taper as previously directed. 2. Provided literature on Humira for patient review 3  Call office for follow-up appointment in a few weeks 4.  Follow up colonoscopy in 5 years  eSigned:  Roxy Cedar, MD 05/04/2012 3:44 PM cc: Sheliah Hatch, MD and The Patient   PATIENT NAME:  Fateh, Kindle MR#: [id number]

## 2012-05-04 NOTE — Progress Notes (Signed)
Patient did not experience any of the following events: a burn prior to discharge; a fall within the facility; wrong site/side/patient/procedure/implant event; or a hospital transfer or hospital admission upon discharge from the facility. (G8907) Patient did not have preoperative order for IV antibiotic SSI prophylaxis. (G8918)  

## 2012-05-04 NOTE — Progress Notes (Signed)
Patient snoring loudly while sitting upright. Patient stating he did not bring his CPAP with him. Patient relates an on and off again history of actually using the CPAP, not using last evening. Patient stating he had stopped smoking yet was nervous  Today and smoked on the way to appointment. Informed [facility] CRNA of patient's snoring in upright position and history of OSA.

## 2012-05-05 ENCOUNTER — Telehealth: Payer: Self-pay | Admitting: *Deleted

## 2012-05-05 NOTE — Telephone Encounter (Signed)
  Called cell number on account, left message, follow-up

## 2012-05-06 ENCOUNTER — Telehealth: Payer: Self-pay | Admitting: *Deleted

## 2012-05-06 NOTE — Telephone Encounter (Signed)
Please clarify sig on med

## 2012-05-06 NOTE — Telephone Encounter (Signed)
1 tab po 1 hr prior to activity, disp 1 month supply, 6 refills

## 2012-05-06 NOTE — Telephone Encounter (Signed)
Ok for 1 month, 6 refills

## 2012-05-06 NOTE — Telephone Encounter (Signed)
Last OV 12-15-11

## 2012-05-07 MED ORDER — VARDENAFIL HCL 20 MG PO TABS: ORAL_TABLET | ORAL | Status: DC

## 2012-05-07 NOTE — Telephone Encounter (Signed)
Rx sent, Discuss with patient  

## 2012-05-10 ENCOUNTER — Encounter: Payer: Self-pay | Admitting: Internal Medicine

## 2012-05-21 ENCOUNTER — Ambulatory Visit: Payer: BC Managed Care – PPO | Admitting: Internal Medicine

## 2012-06-07 ENCOUNTER — Telehealth: Payer: Self-pay

## 2012-06-07 ENCOUNTER — Ambulatory Visit: Payer: BC Managed Care – PPO | Admitting: Internal Medicine

## 2012-06-07 NOTE — Telephone Encounter (Signed)
Message copied by Chrystie Nose on Mon Jun 07, 2012  3:26 PM ------      Message from: Hilarie Fredrickson      Created: Mon Jun 07, 2012  3:15 PM      Regarding: Missed appointment       Bonita Quin, patient has Crohn's disease. Missed. followup appointment today. Contact him and ask him to reschedule. Thanks ------

## 2012-06-07 NOTE — Telephone Encounter (Signed)
Left message for pt to call back  °

## 2012-06-08 NOTE — Telephone Encounter (Signed)
Left message for pt to call back. Called work number listed and was told this person does not work there.

## 2012-06-09 NOTE — Telephone Encounter (Signed)
Left message for pt to call back. Have been unable to reach patient. Letter mailed to pt regarding missing his appointment and that he needs to call us to reschedule the appt and see Dr. Marina Goodell. Dr. Marina Goodell aware.

## 2012-06-14 ENCOUNTER — Ambulatory Visit: Payer: BC Managed Care – PPO | Admitting: Family Medicine

## 2012-06-14 DIAGNOSIS — Z0289 Encounter for other administrative examinations: Secondary | ICD-10-CM

## 2012-06-29 ENCOUNTER — Ambulatory Visit: Payer: BC Managed Care – PPO | Admitting: Internal Medicine

## 2012-07-02 ENCOUNTER — Telehealth: Payer: Self-pay

## 2012-07-02 NOTE — Telephone Encounter (Signed)
Message copied by Jeanine Luz on Fri Jul 02, 2012  8:52 AM ------      Message from: Karna Christmas D      Created: Tue Jun 29, 2012  8:32 AM       Pt resch'd until 07-12-12 because he is sick ------

## 2012-07-02 NOTE — Telephone Encounter (Signed)
Patient cancelled appointment because of illness

## 2012-07-12 ENCOUNTER — Ambulatory Visit (INDEPENDENT_AMBULATORY_CARE_PROVIDER_SITE_OTHER): Payer: BC Managed Care – PPO | Admitting: Internal Medicine

## 2012-07-12 ENCOUNTER — Encounter: Payer: Self-pay | Admitting: Internal Medicine

## 2012-07-12 VITALS — BP 134/78 | HR 92 | Ht 69.0 in | Wt 269.0 lb

## 2012-07-12 DIAGNOSIS — K603 Anal fistula, unspecified: Secondary | ICD-10-CM

## 2012-07-12 DIAGNOSIS — K6289 Other specified diseases of anus and rectum: Secondary | ICD-10-CM

## 2012-07-12 DIAGNOSIS — K5080 Crohn's disease of both small and large intestine without complications: Secondary | ICD-10-CM

## 2012-07-12 MED ORDER — CIPROFLOXACIN HCL 500 MG PO TABS: 500.0000 mg | ORAL_TABLET | Freq: Two times a day (BID) | ORAL | Status: DC

## 2012-07-12 MED ORDER — HYDROCODONE-ACETAMINOPHEN 5-500 MG PO TABS: ORAL_TABLET | ORAL | Status: DC

## 2012-07-12 MED ORDER — HYDROCODONE-ACETAMINOPHEN 5-325 MG PO TABS: ORAL_TABLET | ORAL | Status: DC

## 2012-07-12 MED ORDER — METRONIDAZOLE 500 MG PO TABS: 500.0000 mg | ORAL_TABLET | Freq: Two times a day (BID) | ORAL | Status: DC

## 2012-07-12 NOTE — Progress Notes (Signed)
HISTORY OF PRESENT ILLNESS:  [name] is a 42 y.o. 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 Recurrent problems with small bowel obstruction due to a combination of fixed stenotic disease with superimposed inflammatory disease. A history of medical noncompliance. He was seen in the office 04/12/2012 after a long medical hiatus 6 years. This, after being hospitalized with a flare of ileal Crohn's disease. At that time he was on prednisone. We discussed long-term therapy including immunomodulators (which he had been on previously) and biologic agents. He underwent colonoscopy 05/04/2012. He was found to have active ileal Crohn's disease immediately proximal to the anastomosis as well as a focal Crohn's ulcer in the right colon. He tapered off of prednisone as directed. He is missed several office followups until this time. He did have a diminutive adenoma removed. Since his colonoscopy he states that he is having no significant abdominal complaints. He has been working on weight reduction and is pleased to report weight loss of 25 or 30 pounds in the past 2-1/2 months. Does have a new complaints of perirectal pain without fever. We gave him literature on Humira which he has reviewed in detail  REVIEW OF SYSTEMS:  All non-GI ROS negative   Past Medical History  Diagnosis Date  . Crohn disease   . Hypertriglyceridemia   . Low testosterone   . OSA on CPAP   . Colon polyps     Past Surgical History  Procedure Laterality Date  . Colon surgery      Social History [name]  reports that he quit smoking about 8 months ago. He has never used smokeless tobacco. He reports that he drinks about 0.6 ounces of alcohol per week. He reports that he does not use illicit drugs.  family history includes Arthritis in his mother; Cancer in his mother; and Diabetes in his mother.  No Known Allergies     PHYSICAL EXAMINATION: Vital signs: BP 134/78  Pulse  92  Ht 5\' 9"  (1.753 m)  Wt 269 lb (122.018 kg)  BMI 39.71 kg/m2  Constitutional: generally well-appearing, no acute distress Psychiatric: alert and oriented x3, cooperative Eyes: extraocular movements intact, anicteric, conjunctiva pink Mouth: oral pharynx moist, no lesions Neck: supple no lymphadenopathy Cardiovascular: heart regular rate and rhythm, no murmur Lungs: clear to auscultation bilaterally Abdomen: soft, obese , nontender, nondistended, no obvious ascites, no peritoneal signs, normal bowel sounds, no organomegaly. Surgical incision well-healed without hernia  Rectal: tender perirectal fistula without abscess on the left buttock at approximately 11:00 Extremities: no lower extremity edema bilaterally Skin: no lesions on visible extremities Neuro: No focal deficits.   ASSESSMENT:  #1. Crohn's ileocolitis. Active ileal disease with recent hospitalization for the same. Minor colonic disease #2. Incidental adenoma on recent colonoscopy #3. Perirectal fistula as described  #4. History of medical noncompliance  PLAN:  #1. Recommended that he initiate biologic therapy as he has active Crohn's disease. As well, now, perirectal fistulas disease. I informed him that he is at risk for significant complications without therapy, including the need for repeat surgery. Despite this, he is currently against additional medical therapy stating concerns over potential side effects. #2. Metronidazole 500 mg by mouth twice a day x2 weeks #3. Ciprofloxacin 500 mg by mouth twice a day x2 weeks #4. Vicodin 5/325, #30, 1 by mouth every 4-6 hours when necessary pain (patient requested pain medication) #5. Sitz baths twice a day #6. Contact the office if perirectal discomfort does not  improve or worsens. He may need to see a surgeon #7. Routine office followup in 3 months. #8. Surveillance colonoscopy around April 2019

## 2012-07-12 NOTE — Patient Instructions (Addendum)
We have sent the following medications to your pharmacy for you to pick up at your convenience:  Cipro, Flagyl, Vicodin

## 2012-08-30 ENCOUNTER — Ambulatory Visit (INDEPENDENT_AMBULATORY_CARE_PROVIDER_SITE_OTHER): Payer: BC Managed Care – PPO | Admitting: Family Medicine

## 2012-08-30 ENCOUNTER — Encounter: Payer: Self-pay | Admitting: Family Medicine

## 2012-08-30 VITALS — BP 104/60 | HR 74 | Temp 98.3°F | Ht 69.5 in | Wt 256.6 lb

## 2012-08-30 DIAGNOSIS — M25569 Pain in unspecified knee: Secondary | ICD-10-CM

## 2012-08-30 DIAGNOSIS — M25562 Pain in left knee: Secondary | ICD-10-CM | POA: Insufficient documentation

## 2012-08-30 MED ORDER — MELOXICAM 15 MG PO TABS: 15.0000 mg | ORAL_TABLET | Freq: Every day | ORAL | Status: DC

## 2012-08-30 MED ORDER — TRAMADOL HCL 50 MG PO TABS: 50.0000 mg | ORAL_TABLET | Freq: Three times a day (TID) | ORAL | Status: DC | PRN

## 2012-08-30 NOTE — Patient Instructions (Addendum)
Schedule your complete physical for November We'll call you with your ortho appt Start the Mobic daily for inflammation Use the Ultram as needed for pain ICE! Call with any questions or concerns Hang in there! Happy Birthday!!

## 2012-08-30 NOTE — Assessment & Plan Note (Signed)
New.  Pt has been working hard to lose weight but now having worsening L knee pain.  Start daily scheduled NSAID, ice, refer to ortho.  Reviewed supportive care and red flags that should prompt return.  Pt expressed understanding and is in agreement w/ plan.

## 2012-08-30 NOTE — Progress Notes (Signed)
  Subjective:    Patient ID: [name], male    DOB: 11-Nov-1970, 42 y.o.   MRN: [id number]  HPI Knee pain- L knee pain.  'i have bad knees'.  Has lost ~50 lbs recently through healthy diet and walking on treadmill.  Walks on incline at 3.5 mph x1 hr.  Now limping.  Attempted to run a few weeks ago and 'this really aggravated it'.  Has started wearing neoprene sleeve while walking w/ some improvement of sxs.  Knee pain in anterolateral.  No cracking/popping.  No sensation of leg giving way or weakness.  Pain is worse w/ knee flexion.  Mild swelling.   Review of Systems For ROS see HPI     Objective:   Physical Exam  Vitals reviewed. Constitutional: He appears well-developed and well-nourished. No distress.  Cardiovascular: Intact distal pulses.   Musculoskeletal: He exhibits tenderness (over medial epicondyle of L knee). He exhibits no edema.  No effusion, no swelling of L knee Pain w/ flexion of L knee No pain w/ extension  Neurological: He has normal reflexes. Coordination normal.          Assessment & Plan:

## 2012-10-06 ENCOUNTER — Telehealth: Payer: Self-pay | Admitting: Internal Medicine

## 2012-10-06 NOTE — Telephone Encounter (Signed)
Mr. [name] presented to the [facility] ER last night with signs and symptoms consistent with Crohn's flare ER M.D. called for a vice also CT scan was done which showed ileitis Patient nontoxic and per M.D. evaluation there did not meet admission criteria Plan was to discharge patient on prednisone 40 mg daily with short GI office followup for management of Crohn's flare

## 2012-10-06 NOTE — Telephone Encounter (Signed)
[name], get him an appointment with an extender and get outside records for them as well

## 2012-10-06 NOTE — Telephone Encounter (Signed)
Pt scheduled to see Doug Sou PA Friday 10/08/12@10am . Pt aware of appt and [facility] called for records.

## 2012-10-08 ENCOUNTER — Ambulatory Visit: Payer: BC Managed Care – PPO | Admitting: Gastroenterology

## 2012-12-02 ENCOUNTER — Other Ambulatory Visit: Payer: Self-pay

## 2012-12-16 ENCOUNTER — Encounter: Payer: BC Managed Care – PPO | Admitting: Family Medicine

## 2013-03-04 ENCOUNTER — Ambulatory Visit: Payer: BC Managed Care – PPO | Admitting: Physician Assistant

## 2013-03-10 ENCOUNTER — Telehealth: Payer: Self-pay

## 2013-03-10 NOTE — Telephone Encounter (Signed)
Was able to reach patient.  Patient was asleep and awaken to take call.  Appointment was confirmed, but he asked that Pre-Visit information be collected during the visit.

## 2013-03-11 ENCOUNTER — Encounter: Payer: Self-pay | Admitting: Family Medicine

## 2013-03-11 ENCOUNTER — Ambulatory Visit (INDEPENDENT_AMBULATORY_CARE_PROVIDER_SITE_OTHER): Payer: BC Managed Care – PPO | Admitting: Family Medicine

## 2013-03-11 VITALS — BP 124/80 | HR 96 | Temp 98.5°F | Resp 16 | Ht 70.0 in | Wt 292.4 lb

## 2013-03-11 DIAGNOSIS — M25561 Pain in right knee: Secondary | ICD-10-CM

## 2013-03-11 DIAGNOSIS — Z0000 Encounter for general adult medical examination without abnormal findings: Secondary | ICD-10-CM

## 2013-03-11 DIAGNOSIS — M25569 Pain in unspecified knee: Secondary | ICD-10-CM

## 2013-03-11 LAB — LIPID PANEL
CHOL/HDL RATIO: 5
Cholesterol: 166 mg/dL (ref 0–200)
HDL: 34.4 mg/dL — ABNORMAL LOW (ref >39.00)
LDL Cholesterol: 100 mg/dL — ABNORMAL HIGH (ref 0–99)
Triglycerides: 156 mg/dL — ABNORMAL HIGH (ref 0.0–149.0)
VLDL: 31.2 mg/dL (ref 0.0–40.0)

## 2013-03-11 LAB — CBC WITH DIFFERENTIAL/PLATELET
BASOS ABS: 0 10*3/uL (ref 0.0–0.1)
BASOS PCT: 0.3 % (ref 0.0–3.0)
EOS ABS: 0.2 10*3/uL (ref 0.0–0.7)
Eosinophils Relative: 1.5 % (ref 0.0–5.0)
HCT: 45 % (ref 39.0–52.0)
Hemoglobin: 14.7 g/dL (ref 13.0–17.0)
Lymphocytes Relative: 14.1 % (ref 12.0–46.0)
Lymphs Abs: 1.8 10*3/uL (ref 0.7–4.0)
MCHC: 32.8 g/dL (ref 30.0–36.0)
MCV: 82.8 fl (ref 78.0–100.0)
MONO ABS: 0.8 10*3/uL (ref 0.1–1.0)
Monocytes Relative: 6.6 % (ref 3.0–12.0)
NEUTROS PCT: 77.5 % — AB (ref 43.0–77.0)
Neutro Abs: 9.7 10*3/uL — ABNORMAL HIGH (ref 1.4–7.7)
Platelets: 423 10*3/uL — ABNORMAL HIGH (ref 150.0–400.0)
RBC: 5.43 Mil/uL (ref 4.22–5.81)
RDW: 15.6 % — AB (ref 11.5–14.6)
WBC: 12.5 10*3/uL — ABNORMAL HIGH (ref 4.5–10.5)

## 2013-03-11 LAB — TSH: TSH: 1.52 u[IU]/mL (ref 0.35–5.50)

## 2013-03-11 LAB — HEPATIC FUNCTION PANEL
ALK PHOS: 94 U/L (ref 39–117)
ALT: 35 U/L (ref 0–53)
AST: 30 U/L (ref 0–37)
Albumin: 3.9 g/dL (ref 3.5–5.2)
BILIRUBIN DIRECT: 0 mg/dL (ref 0.0–0.3)
BILIRUBIN TOTAL: 0.6 mg/dL (ref 0.3–1.2)
TOTAL PROTEIN: 7.6 g/dL (ref 6.0–8.3)

## 2013-03-11 LAB — BASIC METABOLIC PANEL
BUN: 13 mg/dL (ref 6–23)
CALCIUM: 9.2 mg/dL (ref 8.4–10.5)
CHLORIDE: 104 meq/L (ref 96–112)
CO2: 27 meq/L (ref 19–32)
Creatinine, Ser: 1.1 mg/dL (ref 0.4–1.5)
GFR: 80.28 mL/min (ref >60.00)
GLUCOSE: 75 mg/dL (ref 70–99)
POTASSIUM: 3.8 meq/L (ref 3.5–5.1)
SODIUM: 139 meq/L (ref 135–145)

## 2013-03-11 MED ORDER — TRAMADOL HCL 50 MG PO TABS: 50.0000 mg | ORAL_TABLET | Freq: Three times a day (TID) | ORAL | Status: DC | PRN

## 2013-03-11 NOTE — Progress Notes (Signed)
Pre visit review using our clinic review tool, if applicable. No additional management support is needed unless otherwise documented below in the visit note. 

## 2013-03-11 NOTE — Assessment & Plan Note (Signed)
Pt's PE WNL w/ exception of obesity.  Check labs.  Anticipatory guidance provided.  

## 2013-03-11 NOTE — Patient Instructions (Signed)
Follow up in 6 months to recheck triglycerides We'll notify you of your lab results and make any changes if needed Start the Tramadol as needed for pain We'll call you with your ortho appt Try and get regular exercise and make healthy food choices Call with any questions or concerns Happy Valentine's Day!!

## 2013-03-11 NOTE — Progress Notes (Signed)
   Subjective:    Patient ID: [name], male    DOB: May 20, 1970, 43 y.o.   MRN: [id number]  HPI CPE- no concerns today w/ exception of L knee pain.  Had R knee pain previously and got steroid injxn and sxs resolved.  Pt interested in seeing ortho again but has moved to [place] [employer]  Also interested in pain meds to help until he sees ortho  GI- Henrene Pastor (Crohn's)    Review of Systems Patient reports no vision/hearing changes, anorexia, fever ,adenopathy, persistant/recurrent hoarseness, swallowing issues, chest pain, palpitations, edema, persistant/recurrent cough, hemoptysis, dyspnea (rest,exertional, paroxysmal nocturnal), gastrointestinal  bleeding (melena, rectal bleeding), abdominal pain, excessive heart burn, GU symptoms (dysuria, hematuria, voiding/incontinence issues) syncope, focal weakness, memory loss, numbness & tingling, skin/hair/nail changes, depression, anxiety, abnormal bruising/bleeding, musculoskeletal symptoms/signs.     Objective:   Physical Exam General Appearance:    Alert, cooperative, no distress, appears stated age, obese  Head:    Normocephalic, without obvious abnormality, atraumatic  Eyes:    PERRL, conjunctiva/corneas clear, EOM's intact, fundi    benign, both eyes       Ears:    Normal TM's and external ear canals, both ears  Nose:   Nares normal, septum midline, mucosa normal, no drainage   or sinus tenderness  Throat:   Lips, mucosa, and tongue normal; teeth and gums normal  Neck:   Supple, symmetrical, trachea midline, no adenopathy;       thyroid:  No enlargement/tenderness/nodules  Back:     Symmetric, no curvature, ROM normal, no CVA tenderness  Lungs:     Clear to auscultation bilaterally, respirations unlabored  Chest wall:    No tenderness or deformity  Heart:    Regular rate and rhythm, S1 and S2 normal, no murmur, rub   or gallop  Abdomen:     Soft, non-tender, bowel sounds active all four quadrants,    no masses, no organomegaly  Genitalia:     Normal male without lesion, masses,discharge or tenderness  Rectal:    Deferred due to young age  Extremities:   Extremities normal, atraumatic, no cyanosis or edema  Pulses:   2+ and symmetric all extremities  Skin:   Skin color, texture, turgor normal, no rashes or lesions  Lymph nodes:   Cervical, supraclavicular, and axillary nodes normal  Neurologic:   CNII-XII intact. Normal strength, sensation and reflexes      throughout          Assessment & Plan:

## 2013-03-11 NOTE — Assessment & Plan Note (Signed)
New.  Referral to ortho entered.  Tramadol script given.

## 2013-04-23 IMAGING — CT CT ABD-PELV W/ CM
1 of 2 series · 15 of 32 positions shown, 19 images · IV contrast (OMNIPAQUE 300)
Comparison: 10/16/2006.

CLINICAL DATA: Right lower quadrant pain.  History Crohn's disease.
Two prior bowel resections.

CT ABDOMEN AND PELVIS WITH CONTRAST
TECHNIQUE: Multidetector CT imaging of the abdomen and pelvis was
performed following the standard protocol during bolus
administration of intravenous contrast.
Contrast: 50mL OMNIPAQUE IOHEXOL 300 MG/ML  SOLN, 100mL OMNIPAQUE
IOHEXOL 300 MG/ML  SOLN

[Series 2: abd/pel with · axial · 0.82mm/px · z∈[-429,+11]mm · 15 of 98 slices shown, 19 images]
[im 5/98  soft-tissue]
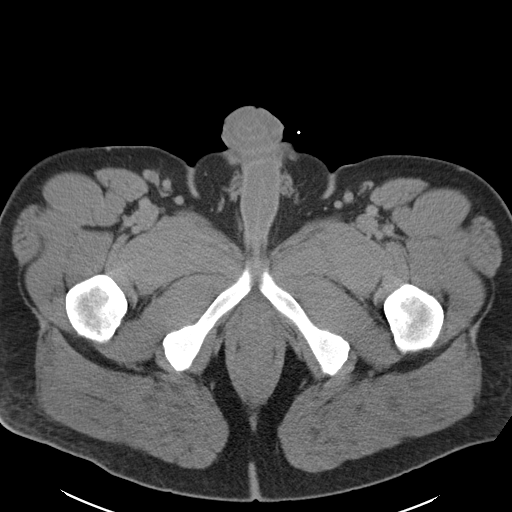
[im 5/98  bone]
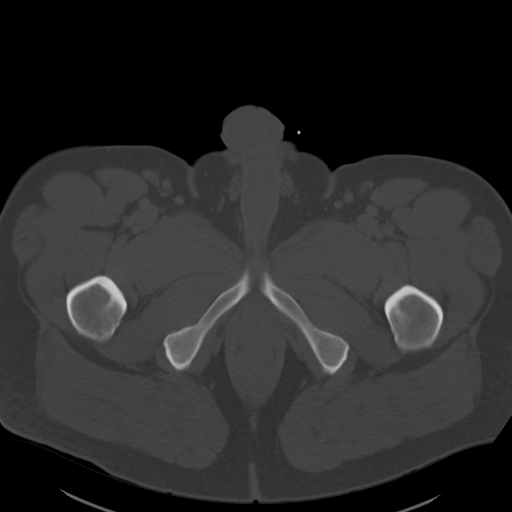
[im 13/98  soft-tissue]
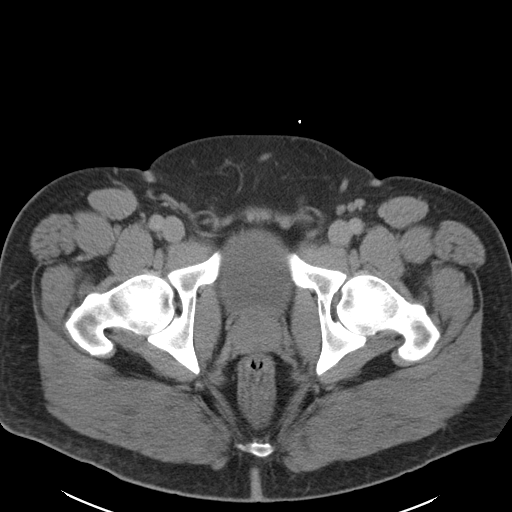
[im 22/98  soft-tissue]
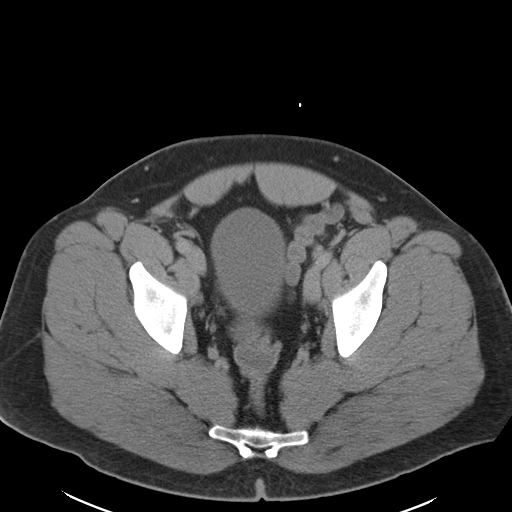
[im 26/98  soft-tissue]
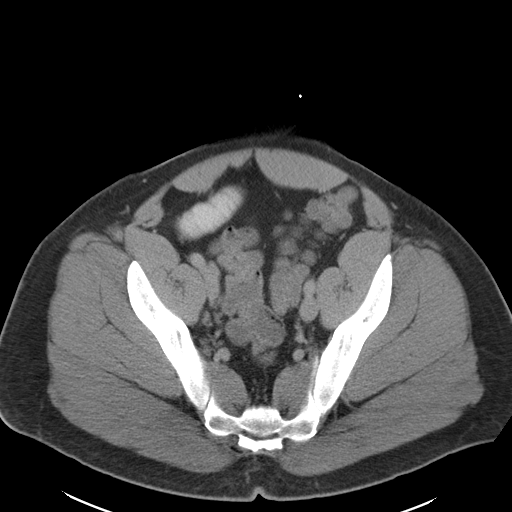
[im 34/98  soft-tissue]
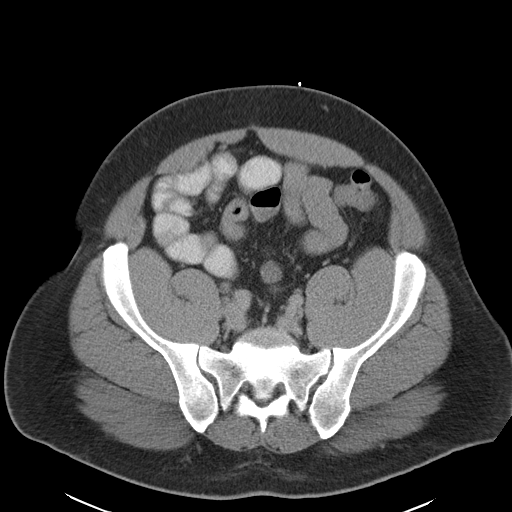
[im 43/98  soft-tissue]
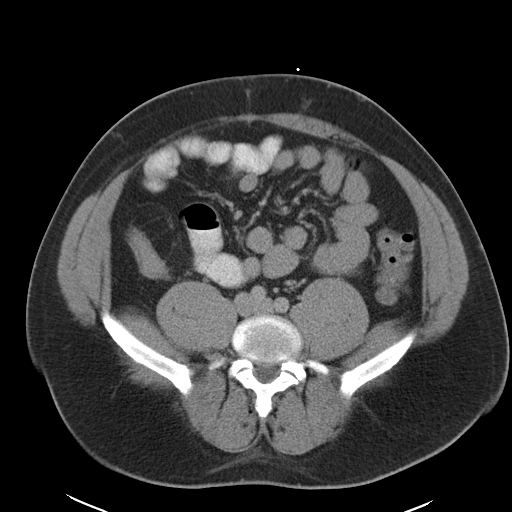
[im 51/98  soft-tissue]
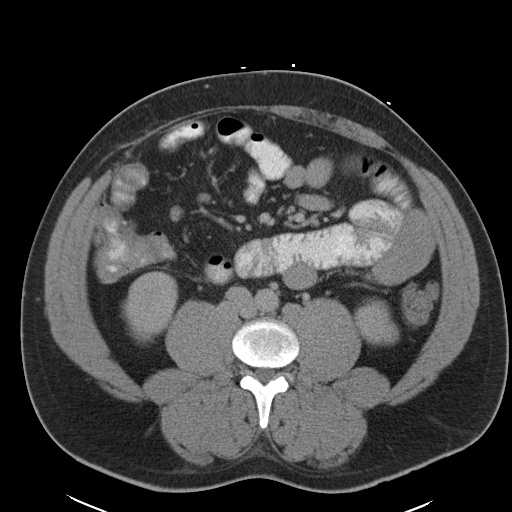
[im 55/98  soft-tissue]
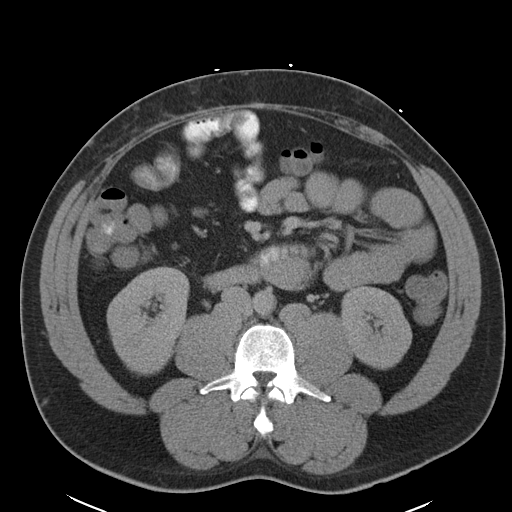
[im 64/98  soft-tissue]
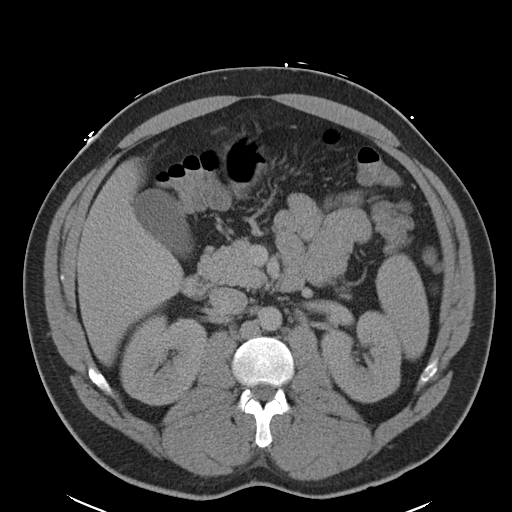
[im 64/98  bone]
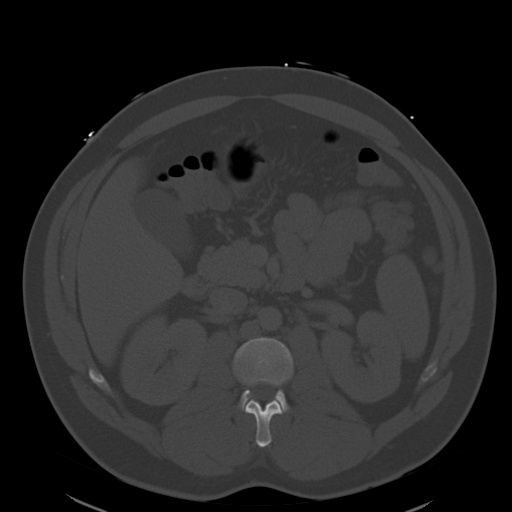
[im 72/98  soft-tissue]
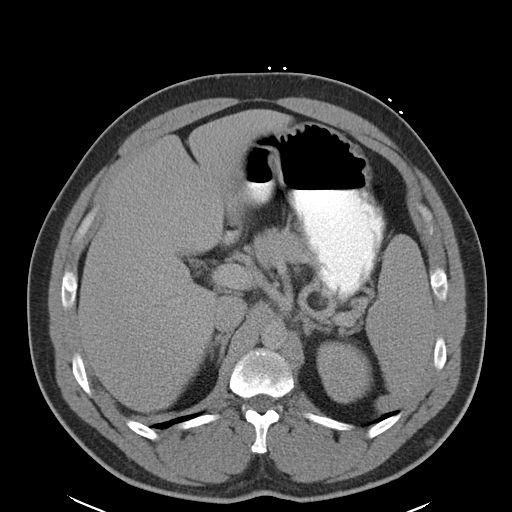
[im 76/98  soft-tissue]
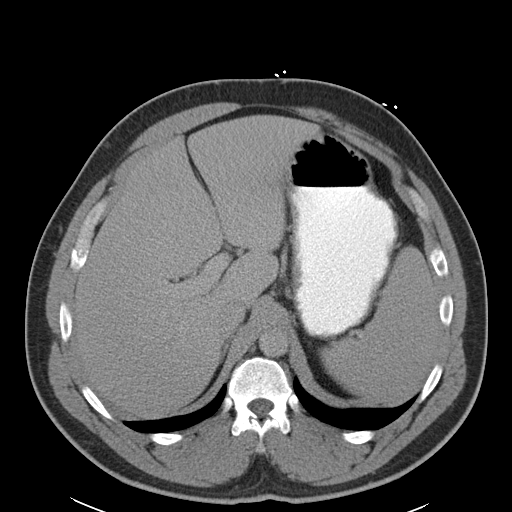
[im 81/98  lung]
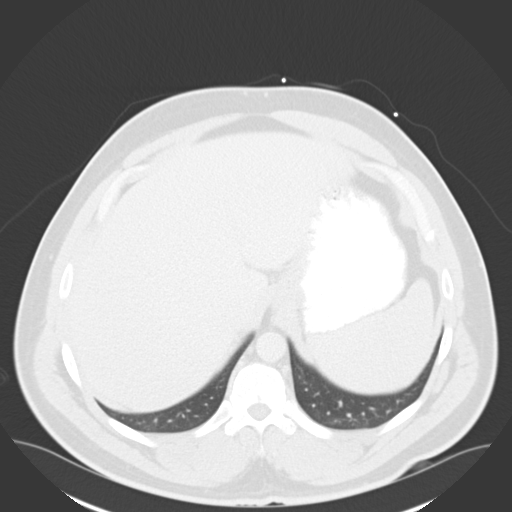
[im 85/98  soft-tissue]
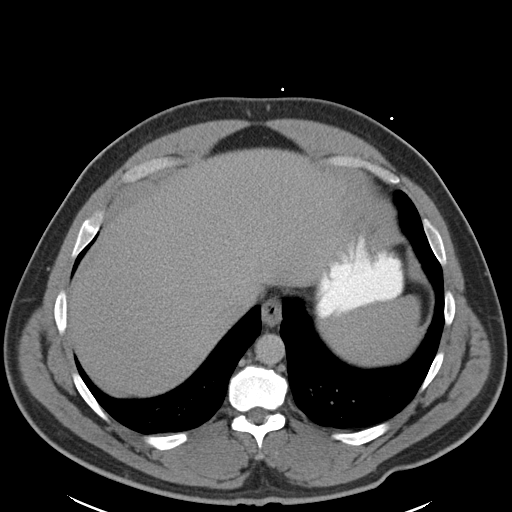
[im 85/98  lung]
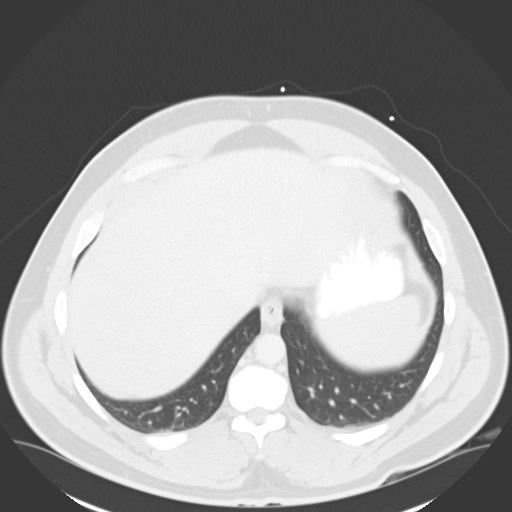
[im 89/98  lung]
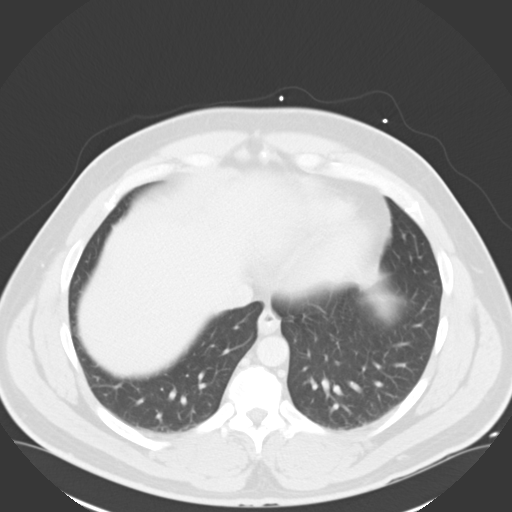
[im 93/98  soft-tissue]
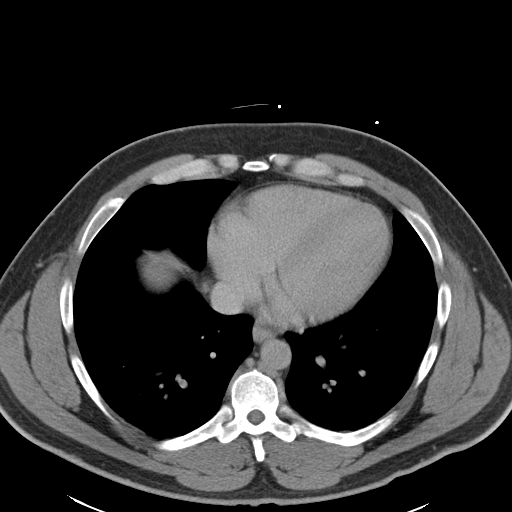
[im 93/98  lung]
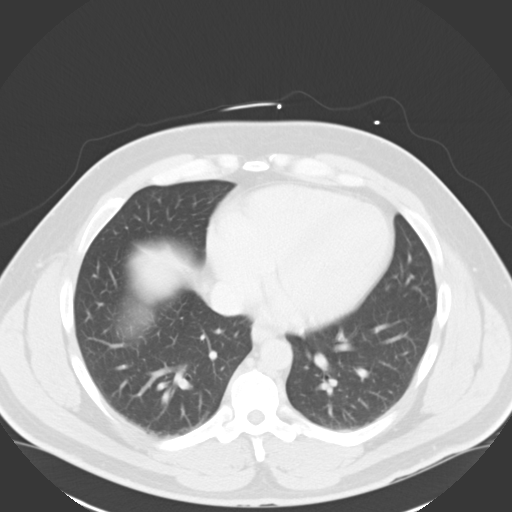

[15 of 32 positions shown; findings below may reference images not displayed]

FINDINGS: Prior resection of bowel with ileal colonic anastomoses.
Minimal stranding of fat planes adjacent to anastomose  may
represent scarring from prior inflammation.  The distal small bowel
loop entering the anastomoses has slightly thickened wall.  Mild
inflammation not excluded. No proximal obstruction.

No free intraperitoneal air, abnormal fluid collection or discrete
fistula identified.

No focal hepatic, splenic, pancreatic, adrenal or renal lesion.  No
calcified gallstones.

Advanced atherosclerotic type changes with calcification at the
aortic bifurcation.

Noncontrast filled views urinary bladder unremarkable.

L5 bilateral pars defects.

Lung bases clear.
IMPRESSION: Prior resection of bowel with ileal colonic anastomoses.  Minimal
stranding of fat planes adjacent to anastomose  may represent
scarring from prior inflammation.  The distal small bowel loop
entering the anastomoses has slightly thickened wall.  Mild
inflammation not excluded. No proximal obstruction.

No free intraperitoneal air, abnormal fluid collection or discrete
fistula identified.

Atherosclerotic type changes aortic bifurcation.

L5 bilateral pars defects.

## 2013-05-05 ENCOUNTER — Other Ambulatory Visit: Payer: Self-pay | Admitting: Family Medicine

## 2013-05-05 NOTE — Telephone Encounter (Signed)
Med filled and faxed.  

## 2013-05-05 NOTE — Telephone Encounter (Signed)
Last ov 03-11-13 Med filled 03-11-13 #45 with 0

## 2013-07-15 ENCOUNTER — Other Ambulatory Visit: Payer: Self-pay | Admitting: Family Medicine

## 2013-07-15 NOTE — Telephone Encounter (Signed)
Last seen 03/11/13 and filled 05/05/13 #45. Please advise    KP

## 2013-08-11 ENCOUNTER — Other Ambulatory Visit: Payer: Self-pay | Admitting: Family Medicine

## 2013-08-12 NOTE — Telephone Encounter (Signed)
Med filled and faxed.  

## 2013-08-12 NOTE — Telephone Encounter (Signed)
Last OV 03-11-13 Med last filled 03-11-13 #45 with 0

## 2013-09-15 ENCOUNTER — Other Ambulatory Visit: Payer: Self-pay | Admitting: Family Medicine

## 2013-09-15 NOTE — Telephone Encounter (Signed)
Med filled and faxed.  

## 2013-09-15 NOTE — Telephone Encounter (Signed)
Last OV 03-11-13 Med filled 08-11-13 #45 with 0

## 2013-09-16 ENCOUNTER — Telehealth: Payer: Self-pay | Admitting: General Practice

## 2013-09-16 NOTE — Telephone Encounter (Signed)
Was notified by Tiffany that pt was calling in regards to ultram rx. This was somehow forwarded to the [facility] [address]. Per pt he would like it to be resent to the [facility] in [place]. I called [address] [facility] and [facility] rx and called [facility] [place] to have them fill this medication.

## 2013-11-30 ENCOUNTER — Telehealth: Payer: Self-pay | Admitting: General Practice

## 2013-11-30 NOTE — Telephone Encounter (Signed)
Ok for #45

## 2013-11-30 NOTE — Telephone Encounter (Signed)
Last OV 03/11/13 Tramadol last filled 09-15-13 #45 with 0

## 2013-12-01 MED ORDER — ULTRAM 50 MG PO TABS: ORAL_TABLET | ORAL | Status: DC

## 2013-12-01 NOTE — Telephone Encounter (Signed)
Med filled and faxed.  

## 2014-04-01 ENCOUNTER — Other Ambulatory Visit: Payer: Self-pay | Admitting: Family Medicine

## 2014-04-03 ENCOUNTER — Telehealth: Payer: Self-pay

## 2014-04-03 NOTE — Telephone Encounter (Signed)
Next appointment scheduled for 05/05/14.

## 2014-04-03 NOTE — Telephone Encounter (Signed)
Last filled:  12/01/13 Amt: 45, 0 Refills Last OV: 03/11/13  Pt needs an appt.  Called pt and left a message for call back.

## 2014-04-03 NOTE — Telephone Encounter (Signed)
Pt scheduled for 05/05/14, and CPE is scheduled for 06/2014 Tramadol last filled 07-15-13 #45 with 0

## 2014-04-03 NOTE — Telephone Encounter (Signed)
Next appointment scheduled for 05/05/14.    Please advise regarding refill.

## 2014-04-03 NOTE — Telephone Encounter (Signed)
Left a message for call back.   Pt needs to scheduled a medication follow up appointment.  Last OV: 03/11/13.

## 2014-05-05 ENCOUNTER — Ambulatory Visit (INDEPENDENT_AMBULATORY_CARE_PROVIDER_SITE_OTHER): Payer: BLUE CROSS/BLUE SHIELD | Admitting: Family Medicine

## 2014-05-05 ENCOUNTER — Other Ambulatory Visit: Payer: Self-pay | Admitting: General Practice

## 2014-05-05 ENCOUNTER — Encounter: Payer: Self-pay | Admitting: Family Medicine

## 2014-05-05 VITALS — BP 128/78 | HR 76 | Temp 98.2°F | Ht 70.0 in | Wt 296.5 lb

## 2014-05-05 DIAGNOSIS — G4730 Sleep apnea, unspecified: Secondary | ICD-10-CM | POA: Diagnosis not present

## 2014-05-05 DIAGNOSIS — M25562 Pain in left knee: Secondary | ICD-10-CM

## 2014-05-05 DIAGNOSIS — E291 Testicular hypofunction: Secondary | ICD-10-CM

## 2014-05-05 DIAGNOSIS — R945 Abnormal results of liver function studies: Principal | ICD-10-CM

## 2014-05-05 DIAGNOSIS — R7989 Other specified abnormal findings of blood chemistry: Secondary | ICD-10-CM

## 2014-05-05 LAB — BASIC METABOLIC PANEL
BUN: 13 mg/dL (ref 6–23)
CO2: 27 mEq/L (ref 19–32)
Calcium: 9.7 mg/dL (ref 8.4–10.5)
Chloride: 102 mEq/L (ref 96–112)
Creatinine, Ser: 0.92 mg/dL (ref 0.40–1.50)
GFR: 96.05 mL/min (ref >60.00)
Glucose, Bld: 92 mg/dL (ref 70–99)
POTASSIUM: 4 meq/L (ref 3.5–5.1)
Sodium: 136 mEq/L (ref 135–145)

## 2014-05-05 LAB — CBC WITH DIFFERENTIAL/PLATELET
Basophils Absolute: 0 10*3/uL (ref 0.0–0.1)
Basophils Relative: 0.2 % (ref 0.0–3.0)
Eosinophils Absolute: 0.1 10*3/uL (ref 0.0–0.7)
Eosinophils Relative: 1.5 % (ref 0.0–5.0)
HCT: 43.4 % (ref 39.0–52.0)
Hemoglobin: 15 g/dL (ref 13.0–17.0)
Lymphocytes Relative: 22 % (ref 12.0–46.0)
Lymphs Abs: 1.7 10*3/uL (ref 0.7–4.0)
MCHC: 34.5 g/dL (ref 30.0–36.0)
MCV: 86.3 fl (ref 78.0–100.0)
Monocytes Absolute: 0.5 10*3/uL (ref 0.1–1.0)
Monocytes Relative: 6.5 % (ref 3.0–12.0)
Neutro Abs: 5.4 10*3/uL (ref 1.4–7.7)
Neutrophils Relative %: 69.8 % (ref 43.0–77.0)
Platelets: 268 10*3/uL (ref 150.0–400.0)
RBC: 5.03 Mil/uL (ref 4.22–5.81)
RDW: 12.9 % (ref 11.5–15.5)
WBC: 7.7 10*3/uL (ref 4.0–10.5)

## 2014-05-05 LAB — HEPATIC FUNCTION PANEL
ALT: 65 U/L — ABNORMAL HIGH (ref 0–53)
AST: 45 U/L — ABNORMAL HIGH (ref 0–37)
Albumin: 4.3 g/dL (ref 3.5–5.2)
Alkaline Phosphatase: 98 U/L (ref 39–117)
Bilirubin, Direct: 0.1 mg/dL (ref 0.0–0.3)
Total Bilirubin: 0.3 mg/dL (ref 0.2–1.2)
Total Protein: 7.6 g/dL (ref 6.0–8.3)

## 2014-05-05 LAB — TSH: TSH: 1.42 u[IU]/mL (ref 0.35–4.50)

## 2014-05-05 LAB — HEMOGLOBIN A1C: Hgb A1c MFr Bld: 5.5 % (ref 4.6–6.5)

## 2014-05-05 LAB — LDL CHOLESTEROL, DIRECT: LDL DIRECT: 102 mg/dL

## 2014-05-05 LAB — LIPID PANEL
CHOL/HDL RATIO: 5
Cholesterol: 200 mg/dL (ref 0–200)
HDL: 37.5 mg/dL — AB (ref >39.00)
NONHDL: 162.5
TRIGLYCERIDES: 354 mg/dL — AB (ref 0.0–149.0)
VLDL: 70.8 mg/dL — AB (ref 0.0–40.0)

## 2014-05-05 MED ORDER — FENOFIBRATE 160 MG PO TABS: 160.0000 mg | ORAL_TABLET | Freq: Every day | ORAL | Status: DC

## 2014-05-05 NOTE — Patient Instructions (Signed)
Follow up as scheduled for your physical We'll notify you of your lab results and make any changes if needed Continue to make healthy food choices and get regular exercise as able Call with any questions or concerns Happy Spring!!

## 2014-05-05 NOTE — Progress Notes (Signed)
   Subjective:    Patient ID: [name], male    DOB: Jan 26, 1971, 44 y.o.   MRN: [id number]  HPI Knee pain- chronic problem, pain is intermittent.  Will take tramadol as needed for pain.  Attempting to walk on the treadmill w/ incline- will have pain afterwards.  Difficulty w/ stooping or kneeling.  Tramadol will ease pain.   OSA- intermittent compliance w/ CPAP.  Pt admits to sleeping better when he wears it.  Obesity- chronic problem, having some difficulty w/ exercise due to knee pain.  Trying to make healthy food choices.   Low T- pt would like this rechecked today.   Review of Systems For ROS see HPI     Objective:   Physical Exam  Constitutional: He is oriented to person, place, and time. He appears well-developed and well-nourished. No distress.  obese  HENT:  Head: Normocephalic and atraumatic.  Eyes: Conjunctivae and EOM are normal. Pupils are equal, round, and reactive to light.  Neck: Normal range of motion. Neck supple. No thyromegaly present.  Cardiovascular: Normal rate, regular rhythm, normal heart sounds and intact distal pulses.   No murmur heard. Pulmonary/Chest: Effort normal and breath sounds normal. No respiratory distress.  Abdominal: Soft. Bowel sounds are normal. He exhibits no distension.  Musculoskeletal: He exhibits no edema.  Lymphadenopathy:    He has no cervical adenopathy.  Neurological: He is alert and oriented to person, place, and time. No cranial nerve deficit.  Skin: Skin is warm and dry.  Psychiatric: He has a normal mood and affect. His behavior is normal.  Vitals reviewed.         Assessment & Plan:

## 2014-05-05 NOTE — Progress Notes (Signed)
Pre visit review using our clinic review tool, if applicable. No additional management support is needed unless otherwise documented below in the visit note. 

## 2014-05-05 NOTE — Assessment & Plan Note (Signed)
Chronic problem for pt.  Taking Tramadol w/ some relief.  Not interested in ortho at this time.  Will continue to follow.

## 2014-05-06 NOTE — Assessment & Plan Note (Signed)
Chronic problem.  Wearing CPAP sporadically.  Stressed need for regular use.  Will follow.

## 2014-05-06 NOTE — Assessment & Plan Note (Signed)
Pt w/ hx of similar.  Not current on replacement tx.  Check labs.  Will refer to Endo if labs are again low.

## 2014-05-06 NOTE — Assessment & Plan Note (Signed)
Ongoing issue for pt.  Again stressed the need for healthy diet and regular exercise.  Check labs to risk stratify.

## 2014-05-08 ENCOUNTER — Other Ambulatory Visit: Payer: Self-pay | Admitting: Family Medicine

## 2014-05-08 DIAGNOSIS — R7989 Other specified abnormal findings of blood chemistry: Secondary | ICD-10-CM

## 2014-05-08 LAB — TESTOSTERONE, FREE, TOTAL, SHBG
Sex Hormone Binding: 21 nmol/L (ref 10–50)
Testosterone, Free: 39.1 pg/mL — ABNORMAL LOW (ref 47.0–244.0)
Testosterone-% Free: 2.4 % (ref 1.6–2.9)
Testosterone: 164 ng/dL — ABNORMAL LOW (ref 300–890)

## 2014-06-02 ENCOUNTER — Encounter: Payer: Self-pay | Admitting: Endocrinology

## 2014-06-02 ENCOUNTER — Ambulatory Visit (INDEPENDENT_AMBULATORY_CARE_PROVIDER_SITE_OTHER): Payer: BLUE CROSS/BLUE SHIELD | Admitting: Endocrinology

## 2014-06-02 VITALS — BP 140/80 | HR 79 | Temp 98.9°F | Wt 299.0 lb

## 2014-06-02 DIAGNOSIS — E291 Testicular hypofunction: Secondary | ICD-10-CM | POA: Diagnosis not present

## 2014-06-02 DIAGNOSIS — R7989 Other specified abnormal findings of blood chemistry: Secondary | ICD-10-CM

## 2014-06-02 LAB — IBC PANEL
Iron: 100 ug/dL (ref 42–165)
Saturation Ratios: 22.3 % (ref 20.0–50.0)
Transferrin: 321 mg/dL (ref 212.0–360.0)

## 2014-06-02 LAB — LUTEINIZING HORMONE: LH: 3.88 m[IU]/mL (ref 1.50–9.30)

## 2014-06-02 NOTE — Patient Instructions (Addendum)
blood tests are requested for you today.  We'll let you know about the results. Based on the results, i may be able to prescribe for you a pill called "clomiphene."  Insurance seldom covers it, but it is a cheap generic, especially at [facility] or [facility].  Please consider having weight loss surgery.  It is good for your health.  Here is some information about it.  If you decide to consider further, please call the phone number in the papers, and register for a free informational meeting normalization of testosterone is not known to harm you.  however, there are "theoretical" risks, including increased fertility, hair loss, prostate cancer, benign prostate enlargement, blood clots, liver problems, lower hdl ("good cholesterol"), polycythemia (opposite of anemia), sleep apnea, and behavior changes.

## 2014-06-02 NOTE — Progress Notes (Signed)
Subjective:    Patient ID: [name], male    DOB: 1970-06-21, 44 y.o.   MRN: [id number]  HPI Pt reports he had puberty at the normal age.  He has no biological children.  He says he has never taken illicit androgens.  He has never been on any prescribed medication for hypogonadism.  He does not take antiandrogens.  He has not recently taken opioids.  He and/or wife had infertility during a previous marriage, but had no eval of this.  He denies any XRT or genital infection.  He has never had surgery, or a serious injury to the head or genital area.  He does not consume alcohol excessively.  He has severe weight gain, throughout the body, and assoc decreased libido.   Past Medical History  Diagnosis Date  . Crohn disease   . Hypertriglyceridemia   . Low testosterone   . OSA on CPAP   . Colon polyps     Past Surgical History  Procedure Laterality Date  . Colon surgery      History   Social History  . Marital Status: Single    Spouse Name: N/A  . Number of Children: N/A  . Years of Education: N/A   Occupational History  . Not on file.   Social History Main Topics  . Smoking status: Former Smoker    Quit date: 10/14/2011  . Smokeless tobacco: Never Used     Comment: quit a month ago, was smoking a pack a day for 20 years, patient smoke a cigarette this am  . Alcohol Use: 0.6 oz/week    1 Cans of beer per week     Comment: social  . Drug Use: No  . Sexual Activity: No   Other Topics Concern  . Not on file   Social History Narrative    Current Outpatient Prescriptions on File Prior to Visit  Medication Sig Dispense Refill  . fenofibrate 160 MG tablet Take 1 tablet (160 mg total) by mouth daily. 30 tablet 6  . fish oil-omega-3 fatty acids 1000 MG capsule Take 1 g by mouth daily.    . Multiple Vitamin (MULTIVITAMIN WITH MINERALS) TABS Take 3 tablets by mouth daily.    Marland Kitchen ULTRAM 50 MG tablet TAKE ONE TABLET BY MOUTH EVERY 8 HOURS AS NEEDED 45 tablet 0  .  vardenafil (LEVITRA) 20 MG tablet 1 tab po 1 hr prior to activity 10 tablet 6   No current facility-administered medications on file prior to visit.    No Known Allergies  Family History  Problem Relation Age of Onset  . Arthritis Mother   . Diabetes Mother   . Cancer Mother   . Other Neg Hx     hypogonadism    BP 140/80 mmHg  Pulse 79  Temp(Src) 98.9 F (37.2 C) (Oral)  Wt 299 lb (135.626 kg)  SpO2 97%     Review of Systems denies depression, numbness, fever, decreased urinary stream, gynecomastia, muscle weakness, headache, easy bruising, sob, rash, blurry vision, rhinorrhea, and chest pain.  levitra helps ED sxs.     Objective:   Physical Exam VS: see vs page GEN: no distress HEAD: head: no deformity eyes: no periorbital swelling, no proptosis external nose and ears are normal mouth: no lesion seen NECK: supple, thyroid is not enlarged CHEST WALL: no deformity LUNGS: clear to auscultation BREASTS:  bilat pseudogynecomastia CV: reg rate and rhythm, no murmur ABD: abdomen is soft, nontender.  no hepatosplenomegaly.  not  distended.  no hernia GENITALIA:  Normal male.   MUSCULOSKELETAL: muscle bulk and strength are grossly normal.  no obvious joint swelling.  gait is normal and steady EXTEMITIES: no deformity.  no edema PULSES:  no carotid bruit NEURO:  cn 2-12 grossly intact.   readily moves all 4's.  sensation is intact to touch on all 4's SKIN:  Normal texture and temperature.  No rash or suspicious lesion is visible.  Normal hair distribution. NODES:  None palpable at the neck. PSYCH: alert, well-oriented.  Does not appear anxious nor depressed.    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  Patient is advised the following: Patient Instructions  blood tests are  requested for you today.  We'll let you know about the results. Based on the results, i may be able to prescribe for you a pill called "clomiphene."  Insurance seldom covers it, but it is a cheap generic, especially at [facility] or [facility].  Please consider having weight loss surgery.  It is good for your health.  Here is some information about it.  If you decide to consider further, please call the phone number in the papers, and register for a free informational meeting normalization of testosterone is not known to harm you.  however, there are "theoretical" risks, including increased fertility, hair loss, prostate cancer, benign prostate enlargement, blood clots, liver problems, lower hdl ("good cholesterol"), polycythemia (opposite of anemia), sleep apnea, and behavior changes.   addendum: The results say we can use the pill called "clomiphene." i have sent a prescription to your pharmacy. i have placed a request at the lab, for you to have the blood tests rechecked at the lab in 4-6 weeks.

## 2014-06-03 LAB — PROLACTIN: Prolactin: 4.2 ng/mL (ref 2.1–17.1)

## 2014-06-03 MED ORDER — CLOMIPHENE CITRATE 50 MG PO TABS: ORAL_TABLET | ORAL | Status: DC

## 2014-06-16 ENCOUNTER — Other Ambulatory Visit: Payer: Self-pay | Admitting: General Practice

## 2014-06-16 MED ORDER — FENOFIBRATE 160 MG PO TABS: 160.0000 mg | ORAL_TABLET | Freq: Every day | ORAL | Status: DC

## 2014-06-30 ENCOUNTER — Other Ambulatory Visit: Payer: BLUE CROSS/BLUE SHIELD

## 2014-06-30 DIAGNOSIS — R7989 Other specified abnormal findings of blood chemistry: Secondary | ICD-10-CM

## 2014-07-03 ENCOUNTER — Telehealth: Payer: Self-pay | Admitting: Family Medicine

## 2014-07-03 LAB — TESTOSTERONE,FREE AND TOTAL
Testosterone, Free: 12.2 pg/mL (ref 6.8–21.5)
Testosterone: 344 ng/dL — ABNORMAL LOW (ref 348–1197)

## 2014-07-03 NOTE — Telephone Encounter (Signed)
Pre Visit letter sent  °

## 2014-07-20 ENCOUNTER — Telehealth: Payer: Self-pay | Admitting: Behavioral Health

## 2014-07-20 ENCOUNTER — Encounter: Payer: Self-pay | Admitting: Behavioral Health

## 2014-07-20 NOTE — Telephone Encounter (Signed)
Pre-Visit Call completed with patient and chart updated.   Pre-Visit Info documented in Specialty Comments under [employer].    

## 2014-07-21 ENCOUNTER — Ambulatory Visit (INDEPENDENT_AMBULATORY_CARE_PROVIDER_SITE_OTHER): Payer: BLUE CROSS/BLUE SHIELD | Admitting: Family Medicine

## 2014-07-21 ENCOUNTER — Encounter: Payer: Self-pay | Admitting: Family Medicine

## 2014-07-21 VITALS — BP 136/80 | HR 89 | Temp 98.1°F | Resp 16 | Ht 70.0 in | Wt 307.5 lb

## 2014-07-21 DIAGNOSIS — Z0000 Encounter for general adult medical examination without abnormal findings: Secondary | ICD-10-CM | POA: Diagnosis not present

## 2014-07-21 DIAGNOSIS — Z202 Contact with and (suspected) exposure to infections with a predominantly sexual mode of transmission: Secondary | ICD-10-CM | POA: Diagnosis not present

## 2014-07-21 DIAGNOSIS — E291 Testicular hypofunction: Secondary | ICD-10-CM | POA: Diagnosis not present

## 2014-07-21 DIAGNOSIS — R7989 Other specified abnormal findings of blood chemistry: Secondary | ICD-10-CM

## 2014-07-21 LAB — CBC WITH DIFFERENTIAL/PLATELET
Basophils Absolute: 0 10*3/uL (ref 0.0–0.1)
Basophils Relative: 0.4 % (ref 0.0–3.0)
Eosinophils Absolute: 0.1 10*3/uL (ref 0.0–0.7)
Eosinophils Relative: 1.5 % (ref 0.0–5.0)
HCT: 40 % (ref 39.0–52.0)
Hemoglobin: 13.5 g/dL (ref 13.0–17.0)
LYMPHS PCT: 22.2 % (ref 12.0–46.0)
Lymphs Abs: 2.2 10*3/uL (ref 0.7–4.0)
MCHC: 33.8 g/dL (ref 30.0–36.0)
MCV: 86.6 fl (ref 78.0–100.0)
MONO ABS: 0.6 10*3/uL (ref 0.1–1.0)
Monocytes Relative: 6.2 % (ref 3.0–12.0)
NEUTROS ABS: 7 10*3/uL (ref 1.4–7.7)
NEUTROS PCT: 69.7 % (ref 43.0–77.0)
PLATELETS: 315 10*3/uL (ref 150.0–400.0)
RBC: 4.62 Mil/uL (ref 4.22–5.81)
RDW: 13.9 % (ref 11.5–15.5)
WBC: 10.1 10*3/uL (ref 4.0–10.5)

## 2014-07-21 LAB — LIPID PANEL
Cholesterol: 163 mg/dL (ref 0–200)
HDL: 37.1 mg/dL — ABNORMAL LOW (ref >39.00)
LDL CALC: 97 mg/dL (ref 0–99)
NonHDL: 125.9
Total CHOL/HDL Ratio: 4
Triglycerides: 147 mg/dL (ref 0.0–149.0)
VLDL: 29.4 mg/dL (ref 0.0–40.0)

## 2014-07-21 LAB — BASIC METABOLIC PANEL
BUN: 17 mg/dL (ref 6–23)
CO2: 27 mEq/L (ref 19–32)
CREATININE: 1.08 mg/dL (ref 0.40–1.50)
Calcium: 9.1 mg/dL (ref 8.4–10.5)
Chloride: 104 mEq/L (ref 96–112)
GFR: 79.74 mL/min (ref >60.00)
GLUCOSE: 89 mg/dL (ref 70–99)
Potassium: 3.6 mEq/L (ref 3.5–5.1)
Sodium: 138 mEq/L (ref 135–145)

## 2014-07-21 LAB — HEPATIC FUNCTION PANEL
ALBUMIN: 4.3 g/dL (ref 3.5–5.2)
ALK PHOS: 58 U/L (ref 39–117)
ALT: 35 U/L (ref 0–53)
AST: 29 U/L (ref 0–37)
Bilirubin, Direct: 0 mg/dL (ref 0.0–0.3)
Total Bilirubin: 0.6 mg/dL (ref 0.2–1.2)
Total Protein: 7.2 g/dL (ref 6.0–8.3)

## 2014-07-21 LAB — TSH: TSH: 1.74 u[IU]/mL (ref 0.35–4.50)

## 2014-07-21 MED ORDER — VARDENAFIL HCL 20 MG PO TABS: ORAL_TABLET | ORAL | Status: DC

## 2014-07-21 NOTE — Assessment & Plan Note (Signed)
Repeat labs today since pt has started Clomid.

## 2014-07-21 NOTE — Patient Instructions (Signed)
Follow up in 6 months to recheck cholesterol We'll notify you of your lab results and make any changes if needed Try and make healthy food choices and get regular exercise STOP SMOKING! Call with any questions or concerns Have a great summer!!!

## 2014-07-21 NOTE — Progress Notes (Signed)
   Subjective:    Patient ID: [name], male    DOB: [birth year], 44 y.o.   MRN: [id number]  HPI CPE- no concerns.   Review of Systems Patient reports no vision/hearing changes, anorexia, fever ,adenopathy, persistant/recurrent hoarseness, swallowing issues, chest pain, palpitations, edema, persistant/recurrent cough, hemoptysis, dyspnea (rest,exertional, paroxysmal nocturnal), gastrointestinal  bleeding (melena, rectal bleeding), abdominal pain, excessive heart burn, GU symptoms (dysuria, hematuria, voiding/incontinence issues) syncope, focal weakness, memory loss, numbness & tingling, skin/hair/nail changes, depression, anxiety, abnormal bruising/bleeding, musculoskeletal symptoms/signs.     Objective:   Physical Exam General Appearance:    Alert, cooperative, no distress, appears stated age, obese  Head:    Normocephalic, without obvious abnormality, atraumatic  Eyes:    PERRL, conjunctiva/corneas clear, EOM's intact, fundi    benign, both eyes       Ears:    Normal TM's and external ear canals, both ears  Nose:   Nares normal, septum midline, mucosa normal, no drainage   or sinus tenderness  Throat:   Lips, mucosa, and tongue normal; teeth and gums normal  Neck:   Supple, symmetrical, trachea midline, no adenopathy;       thyroid:  No enlargement/tenderness/nodules  Back:     Symmetric, no curvature, ROM normal, no CVA tenderness  Lungs:     Clear to auscultation bilaterally, respirations unlabored  Chest wall:    No tenderness or deformity  Heart:    Regular rate and rhythm, S1 and S2 normal, no murmur, rub   or gallop  Abdomen:     Soft, non-tender, bowel sounds active all four quadrants,    no masses, no organomegaly  Genitalia:    Normal male without lesion, masses,discharge or tenderness  Rectal:    Deferred due to young age  Extremities:   Extremities normal, atraumatic, no cyanosis or edema  Pulses:   2+ and symmetric all extremities  Skin:   Skin color, texture,  turgor normal, no rashes or lesions  Lymph nodes:   Cervical, supraclavicular, and axillary nodes normal  Neurologic:   CNII-XII intact. Normal strength, sensation and reflexes      throughout          Assessment & Plan:

## 2014-07-21 NOTE — Progress Notes (Signed)
Pre visit review using our clinic review tool, if applicable. No additional management support is needed unless otherwise documented below in the visit note. 

## 2014-07-21 NOTE — Assessment & Plan Note (Signed)
Pt's PE WNL w/ exception of obesity.  Pt strongly encouraged to get regular exercise and make healthy food choices.  Pt also advised to quit smoking.  Check labs.  Anticipatory guidance provided.

## 2014-07-21 NOTE — Addendum Note (Signed)
Addended by: Modena Morrow D on: 07/21/2014 02:48 PM   Modules accepted: Orders

## 2014-07-21 NOTE — Addendum Note (Signed)
Addended by: Peggyann Shoals on: 07/21/2014 02:53 PM   Modules accepted: Orders

## 2014-07-22 LAB — HIV ANTIBODY (ROUTINE TESTING W REFLEX): HIV: NONREACTIVE

## 2014-07-22 LAB — RPR

## 2014-07-22 LAB — GC/CHLAMYDIA PROBE AMP, URINE
CHLAMYDIA, SWAB/URINE, PCR: NEGATIVE
GC PROBE AMP, URINE: NEGATIVE

## 2014-07-24 ENCOUNTER — Telehealth: Payer: Self-pay | Admitting: *Deleted

## 2014-07-24 ENCOUNTER — Other Ambulatory Visit: Payer: Self-pay | Admitting: General Practice

## 2014-07-24 LAB — TESTOSTERONE, FREE, TOTAL, SHBG
Sex Hormone Binding: 23 nmol/L (ref 10–50)
TESTOSTERONE-% FREE: 2.4 % (ref 1.6–2.9)
Testosterone, Free: 66.7 pg/mL (ref 47.0–244.0)
Testosterone: 281 ng/dL — ABNORMAL LOW (ref 300–890)

## 2014-07-24 MED ORDER — ULTRAM 50 MG PO TABS: 50.0000 mg | ORAL_TABLET | Freq: Three times a day (TID) | ORAL | Status: DC | PRN

## 2014-07-24 NOTE — Telephone Encounter (Signed)
Prior authorization for Levitra is not required

## 2014-07-24 NOTE — Telephone Encounter (Signed)
Med filled and faxed.  

## 2014-07-24 NOTE — Telephone Encounter (Signed)
Lt ov 07-21-14 Tramadol last filled 04/03/14 #45 with 0

## 2014-09-14 ENCOUNTER — Other Ambulatory Visit: Payer: Self-pay | Admitting: General Practice

## 2014-09-14 MED ORDER — ULTRAM 50 MG PO TABS: 50.0000 mg | ORAL_TABLET | Freq: Three times a day (TID) | ORAL | Status: DC | PRN

## 2014-09-14 NOTE — Telephone Encounter (Signed)
Medication filled to pharmacy as requested.   

## 2014-09-14 NOTE — Telephone Encounter (Signed)
Last OV 07/24/14 Ultram last filled 07/24/14 #45 with 0

## 2014-11-27 ENCOUNTER — Telehealth: Payer: Self-pay | Admitting: General Practice

## 2014-11-27 MED ORDER — ULTRAM 50 MG PO TABS: 50.0000 mg | ORAL_TABLET | Freq: Three times a day (TID) | ORAL | Status: DC | PRN

## 2014-11-27 NOTE — Telephone Encounter (Signed)
Ok for #45

## 2014-11-27 NOTE — Telephone Encounter (Signed)
Medication filled to pharmacy as requested.   

## 2014-11-27 NOTE — Telephone Encounter (Signed)
Last OV 07-21-14 Tramadol last filled 09-14-14 #45 with 0

## 2014-11-30 ENCOUNTER — Other Ambulatory Visit: Payer: Self-pay | Admitting: Family Medicine

## 2014-11-30 NOTE — Telephone Encounter (Signed)
Medication filled to pharmacy as requested.   

## 2015-01-13 ENCOUNTER — Other Ambulatory Visit: Payer: Self-pay | Admitting: Family Medicine

## 2015-01-15 NOTE — Telephone Encounter (Signed)
Medication filled to pharmacy as requested.   

## 2015-01-17 ENCOUNTER — Other Ambulatory Visit: Payer: Self-pay | Admitting: Endocrinology

## 2015-02-28 ENCOUNTER — Telehealth: Payer: Self-pay | Admitting: Family Medicine

## 2015-02-28 ENCOUNTER — Other Ambulatory Visit: Payer: Self-pay | Admitting: Family Medicine

## 2015-02-28 ENCOUNTER — Other Ambulatory Visit: Payer: Self-pay | Admitting: Endocrinology

## 2015-02-28 DIAGNOSIS — G4733 Obstructive sleep apnea (adult) (pediatric): Secondary | ICD-10-CM

## 2015-02-28 NOTE — Telephone Encounter (Signed)
Called company [facility] to see if they could fax order form to me so that I could order patients supplies. No answer. Will call back. Spoke to patient.

## 2015-02-28 NOTE — Telephone Encounter (Signed)
°  Relation to [id number] Call back number:[phone]  Reason for call:  Patient requesting script for c pap supplies please fax to [facility] (phone) [phone] and fax # [phone]

## 2015-02-28 NOTE — Telephone Encounter (Signed)
Patient declined flu shot  °

## 2015-02-28 NOTE — Telephone Encounter (Signed)
Ok for DME prescription for his CPAP supplies, dx OSA- please print and I will sign

## 2015-02-28 NOTE — Telephone Encounter (Signed)
[facility]ed [facility] again, no answer. Left message for a call back of for Med sluppy order sheet to be filled

## 2015-03-01 NOTE — Telephone Encounter (Signed)
Order printed for signature.

## 2015-03-01 NOTE — Addendum Note (Signed)
Addended by: Bunnie Domino on: 03/01/2015 08:33 AM   Modules accepted: Orders

## 2015-03-01 NOTE — Telephone Encounter (Signed)
Lab order (signed) sent to [facility]

## 2015-03-01 NOTE — Telephone Encounter (Signed)
Shirlean Mylar form [facility] will be faxing form to [phone]

## 2015-03-05 ENCOUNTER — Other Ambulatory Visit: Payer: Self-pay

## 2015-03-05 MED ORDER — ULTRAM 50 MG PO TABS: 50.0000 mg | ORAL_TABLET | Freq: Three times a day (TID) | ORAL | Status: DC | PRN

## 2015-03-05 NOTE — Telephone Encounter (Signed)
Medication filled to pharmacy as requested.   

## 2015-03-08 ENCOUNTER — Telehealth: Payer: Self-pay | Admitting: Family Medicine

## 2015-03-08 DIAGNOSIS — G4730 Sleep apnea, unspecified: Secondary | ICD-10-CM

## 2015-03-08 NOTE — Telephone Encounter (Signed)
Robin from sleep study called back to provide a fax number to send orders to for pt.   Fax: [phone]

## 2015-03-08 NOTE — Telephone Encounter (Signed)
Left message with [facility] requesting fax number to fax Supply request.

## 2015-03-08 NOTE — Telephone Encounter (Signed)
Received fax Number from patient faxed request to [facility] per patient request.

## 2015-03-08 NOTE — Telephone Encounter (Signed)
Faxed earlier

## 2015-03-08 NOTE — Telephone Encounter (Signed)
Caller name:Self  Can be reached: [phone]  Pharmacy:  [facility] [phone]  Reason for call: Patient requesting a rx for Sleep Apnea Supplies

## 2015-03-09 ENCOUNTER — Telehealth: Payer: Self-pay | Admitting: *Deleted

## 2015-03-09 NOTE — Telephone Encounter (Signed)
Received Physicians Prescription & Certificate of Medical Necessity; forwarded to provider/SLS 02/10

## 2015-04-12 ENCOUNTER — Other Ambulatory Visit: Payer: Self-pay | Admitting: Endocrinology

## 2015-04-13 ENCOUNTER — Telehealth: Payer: Self-pay | Admitting: General Practice

## 2015-04-13 MED ORDER — ULTRAM 50 MG PO TABS: 50.0000 mg | ORAL_TABLET | Freq: Three times a day (TID) | ORAL | Status: DC | PRN

## 2015-04-13 NOTE — Telephone Encounter (Signed)
Medication filled to pharmacy as requested.   

## 2015-04-13 NOTE — Telephone Encounter (Signed)
Last OV 07/21/14 Ultram last filled 03/05/15 #45 with 0

## 2015-04-13 NOTE — Telephone Encounter (Signed)
Ok for #45

## 2015-05-17 ENCOUNTER — Other Ambulatory Visit: Payer: Self-pay | Admitting: Family Medicine

## 2015-07-27 ENCOUNTER — Encounter: Payer: Self-pay | Admitting: Family Medicine

## 2015-07-27 ENCOUNTER — Ambulatory Visit (INDEPENDENT_AMBULATORY_CARE_PROVIDER_SITE_OTHER): Payer: BLUE CROSS/BLUE SHIELD | Admitting: Family Medicine

## 2015-07-27 VITALS — BP 122/82 | HR 80 | Temp 98.0°F | Resp 16 | Ht 70.0 in | Wt 318.4 lb

## 2015-07-27 DIAGNOSIS — E291 Testicular hypofunction: Secondary | ICD-10-CM

## 2015-07-27 DIAGNOSIS — K50911 Crohn's disease, unspecified, with rectal bleeding: Secondary | ICD-10-CM | POA: Diagnosis not present

## 2015-07-27 DIAGNOSIS — Z0000 Encounter for general adult medical examination without abnormal findings: Secondary | ICD-10-CM | POA: Diagnosis not present

## 2015-07-27 DIAGNOSIS — R7989 Other specified abnormal findings of blood chemistry: Secondary | ICD-10-CM

## 2015-07-27 LAB — HEPATIC FUNCTION PANEL
ALBUMIN: 4.2 g/dL (ref 3.5–5.2)
ALT: 28 U/L (ref 0–53)
AST: 28 U/L (ref 0–37)
Alkaline Phosphatase: 48 U/L (ref 39–117)
Bilirubin, Direct: 0.1 mg/dL (ref 0.0–0.3)
TOTAL PROTEIN: 7.4 g/dL (ref 6.0–8.3)
Total Bilirubin: 0.4 mg/dL (ref 0.2–1.2)

## 2015-07-27 LAB — BASIC METABOLIC PANEL
BUN: 20 mg/dL (ref 6–23)
CO2: 31 mEq/L (ref 19–32)
CREATININE: 1.04 mg/dL (ref 0.40–1.50)
Calcium: 9.8 mg/dL (ref 8.4–10.5)
Chloride: 102 mEq/L (ref 96–112)
GFR: 82.89 mL/min (ref >60.00)
GLUCOSE: 80 mg/dL (ref 70–99)
Potassium: 4.7 mEq/L (ref 3.5–5.1)
Sodium: 139 mEq/L (ref 135–145)

## 2015-07-27 LAB — CBC WITH DIFFERENTIAL/PLATELET
BASOS ABS: 0 10*3/uL (ref 0.0–0.1)
Basophils Relative: 0.2 % (ref 0.0–3.0)
EOS PCT: 2.8 % (ref 0.0–5.0)
Eosinophils Absolute: 0.2 10*3/uL (ref 0.0–0.7)
HCT: 43.4 % (ref 39.0–52.0)
HEMOGLOBIN: 14.4 g/dL (ref 13.0–17.0)
LYMPHS ABS: 1.7 10*3/uL (ref 0.7–4.0)
Lymphocytes Relative: 20.2 % (ref 12.0–46.0)
MCHC: 33.2 g/dL (ref 30.0–36.0)
MCV: 79.4 fl (ref 78.0–100.0)
MONO ABS: 0.6 10*3/uL (ref 0.1–1.0)
MONOS PCT: 7 % (ref 3.0–12.0)
NEUTROS PCT: 69.8 % (ref 43.0–77.0)
Neutro Abs: 6 10*3/uL (ref 1.4–7.7)
Platelets: 342 10*3/uL (ref 150.0–400.0)
RBC: 5.47 Mil/uL (ref 4.22–5.81)
RDW: 18.3 % — ABNORMAL HIGH (ref 11.5–15.5)
WBC: 8.6 10*3/uL (ref 4.0–10.5)

## 2015-07-27 LAB — LIPID PANEL
CHOL/HDL RATIO: 8
Cholesterol: 189 mg/dL (ref 0–200)
HDL: 25.2 mg/dL — AB (ref >39.00)
LDL CALC: 134 mg/dL — AB (ref 0–99)
NONHDL: 163.82
TRIGLYCERIDES: 149 mg/dL (ref 0.0–149.0)
VLDL: 29.8 mg/dL (ref 0.0–40.0)

## 2015-07-27 LAB — HEMOGLOBIN A1C: Hgb A1c MFr Bld: 5.5 % (ref 4.6–6.5)

## 2015-07-27 LAB — TSH: TSH: 0.95 u[IU]/mL (ref 0.35–4.50)

## 2015-07-27 LAB — PSA: PSA: 0.47 ng/mL (ref 0.10–4.00)

## 2015-07-27 NOTE — Assessment & Plan Note (Signed)
Pt's PE WNL w/ exception of obesity.  Stressed need for healthy diet and regular exercise.  Check labs.  Anticipatory guidance provided.  

## 2015-07-27 NOTE — Progress Notes (Signed)
Pre visit review using our clinic review tool, if applicable. No additional management support is needed unless otherwise documented below in the visit note. 

## 2015-07-27 NOTE — Progress Notes (Signed)
   Subjective:    Patient ID: [name], male    DOB: December 23, 1970, 45 y.o.   MRN: [id number]  HPI CPE- pt has gained 10 lbs since last visit.  Pt is asking for GI specialist in [place] for his Crohns.  + family hx prostate cancer on mom's side (great grandfather, grandfather, uncle).  Pt is asking for referral to Endo in [place] for low testosterone.  Took friend's Testosterone proprionate and 'i felt like i was 54 again'   Review of Systems Patient reports no vision/hearing changes, anorexia, fever ,adenopathy, persistant/recurrent hoarseness, swallowing issues, chest pain, palpitations, edema, persistant/recurrent cough, hemoptysis, dyspnea (rest,exertional, paroxysmal nocturnal), gastrointestinal  bleeding (melena, rectal bleeding), abdominal pain, excessive heart burn, GU symptoms (dysuria, hematuria, voiding/incontinence issues) syncope, focal weakness, memory loss, numbness & tingling, skin/hair/nail changes, depression, anxiety, abnormal bruising/bleeding, musculoskeletal symptoms/signs.     Objective:   Physical Exam General Appearance:    Alert, cooperative, no distress, appears stated age, obese  Head:    Normocephalic, without obvious abnormality, atraumatic  Eyes:    PERRL, conjunctiva/corneas clear, EOM's intact, fundi    benign, both eyes       Ears:    Normal TM's and external ear canals, both ears  Nose:   Nares normal, septum midline, mucosa normal, no drainage   or sinus tenderness  Throat:   Lips, mucosa, and tongue normal; teeth and gums normal  Neck:   Supple, symmetrical, trachea midline, no adenopathy;       thyroid:  No enlargement/tenderness/nodules  Back:     Symmetric, no curvature, ROM normal, no CVA tenderness  Lungs:     Clear to auscultation bilaterally, respirations unlabored  Chest wall:    No tenderness or deformity  Heart:    Regular rate and rhythm, S1 and S2 normal, no murmur, rub   or gallop  Abdomen:     Soft, non-tender, bowel sounds active all four  quadrants,    no masses, no organomegaly  Genitalia:    Normal male without lesion, masses,discharge or tenderness  Rectal:    Deferred due to young age  Extremities:   Extremities normal, atraumatic, no cyanosis or edema  Pulses:   2+ and symmetric all extremities  Skin:   Skin color, texture, turgor normal, no rashes or lesions  Lymph nodes:   Cervical, supraclavicular, and axillary nodes normal  Neurologic:   CNII-XII intact. Normal strength, sensation and reflexes      throughout          Assessment & Plan:

## 2015-07-27 NOTE — Assessment & Plan Note (Signed)
Refer to provider in [place] as this is where pt lives now

## 2015-07-27 NOTE — Assessment & Plan Note (Signed)
Refer to provider in [place] as this is where pt lives now

## 2015-07-27 NOTE — Assessment & Plan Note (Signed)
Deteriorated.  Pt has gained 10 lbs.  Stressed need for healthy diet and regular exercise.  Check labs to risk stratify.  Will follow. 

## 2015-07-27 NOTE — Patient Instructions (Signed)
Follow up in 1 year or as needed We'll notify you of your lab results and make any changes if needed Continue to work on healthy diet and regular exercise- you can do it! We'll call you with your GI and Endo appts Call with any questions or concerns Happy 4th!!!

## 2015-10-18 ENCOUNTER — Other Ambulatory Visit: Payer: Self-pay | Admitting: Family Medicine

## 2015-10-18 MED ORDER — ULTRAM 50 MG PO TABS: 50.0000 mg | ORAL_TABLET | Freq: Three times a day (TID) | ORAL | 0 refills | Status: DC | PRN

## 2015-10-18 NOTE — Telephone Encounter (Signed)
Last OV 07/27/15 Ultram last filled 04/13/15 #45 with 0

## 2015-10-19 NOTE — Telephone Encounter (Signed)
Medication filled to pharmacy as requested.   

## 2016-01-28 ENCOUNTER — Other Ambulatory Visit: Payer: Self-pay | Admitting: Family Medicine

## 2016-02-08 ENCOUNTER — Other Ambulatory Visit: Payer: Self-pay | Admitting: Family Medicine

## 2016-02-08 ENCOUNTER — Encounter: Payer: Self-pay | Admitting: Family Medicine

## 2016-02-12 MED ORDER — ULTRAM 50 MG PO TABS: 50.0000 mg | ORAL_TABLET | Freq: Three times a day (TID) | ORAL | 0 refills | Status: DC | PRN

## 2016-02-12 MED ORDER — FENOFIBRATE 160 MG PO TABS: ORAL_TABLET | ORAL | 0 refills | Status: DC

## 2016-02-12 NOTE — Telephone Encounter (Signed)
Last OV 07/27/15 Tramadol last filled 10/18/15 #45 with 0

## 2016-04-21 ENCOUNTER — Other Ambulatory Visit: Payer: Self-pay | Admitting: Family Medicine

## 2016-04-21 NOTE — Telephone Encounter (Signed)
Last OV 07/27/15 (CPE) Tramadol last filled 02/12/16 #45 with 0

## 2016-04-21 NOTE — Telephone Encounter (Signed)
Medication filled to pharmacy as requested.   

## 2016-05-02 ENCOUNTER — Other Ambulatory Visit: Payer: Self-pay | Admitting: Family Medicine

## 2016-05-30 ENCOUNTER — Encounter: Payer: Self-pay | Admitting: Family Medicine

## 2016-05-30 NOTE — Telephone Encounter (Signed)
Please advise? Pt was supposed to be seeing Loanne Drilling for low T. He was prescribed clomid in the past.  Last OV with Tabori 07/27/15 (CPE) Last OV with Loanne Drilling 06/02/14  Testosterone level has not been drawn since 07/21/14 281

## 2016-06-04 MED ORDER — SILDENAFIL CITRATE 100 MG PO TABS: 50.0000 mg | ORAL_TABLET | Freq: Every day | ORAL | 11 refills | Status: AC | PRN

## 2016-08-01 ENCOUNTER — Encounter: Payer: BLUE CROSS/BLUE SHIELD | Admitting: Family Medicine

## 2016-08-04 ENCOUNTER — Other Ambulatory Visit: Payer: Self-pay | Admitting: Family Medicine

## 2016-08-04 MED ORDER — FENOFIBRATE 160 MG PO TABS: ORAL_TABLET | ORAL | 0 refills | Status: DC

## 2016-08-25 ENCOUNTER — Encounter: Payer: BLUE CROSS/BLUE SHIELD | Admitting: Family Medicine

## 2016-09-05 ENCOUNTER — Encounter: Payer: Self-pay | Admitting: General Practice

## 2016-09-05 ENCOUNTER — Other Ambulatory Visit: Payer: Self-pay | Admitting: Family Medicine

## 2016-09-16 ENCOUNTER — Telehealth: Payer: Self-pay | Admitting: *Deleted

## 2016-09-16 DIAGNOSIS — Z1283 Encounter for screening for malignant neoplasm of skin: Secondary | ICD-10-CM

## 2016-09-16 NOTE — Telephone Encounter (Signed)
Please advise pt has not been seen in over a year

## 2016-09-16 NOTE — Telephone Encounter (Signed)
Patient is scheduled for CPE in December.

## 2016-09-16 NOTE — Telephone Encounter (Signed)
Referral was placed, pt is overdue for a cholesterol follow up.

## 2016-09-16 NOTE — Telephone Encounter (Signed)
Patient is wanting to schedule an appointment with a Dermatologist but his insurance requires a referral.   He is asking if we can put this in so that insurance will pay.  [facility] Phone Number:  [phone]

## 2016-09-16 NOTE — Addendum Note (Signed)
Addended by: Davis Gourd on: 09/16/2016 12:02 PM   Modules accepted: Orders

## 2016-09-16 NOTE — Telephone Encounter (Signed)
Ok for derm referral but please remind pt he is due for an appt

## 2016-10-13 ENCOUNTER — Other Ambulatory Visit: Payer: Self-pay | Admitting: Family Medicine

## 2016-10-14 ENCOUNTER — Other Ambulatory Visit: Payer: Self-pay | Admitting: General Practice

## 2016-10-14 MED ORDER — FENOFIBRATE 160 MG PO TABS: ORAL_TABLET | ORAL | 0 refills | Status: DC

## 2016-11-11 ENCOUNTER — Encounter: Payer: Self-pay | Admitting: Family Medicine

## 2016-11-12 ENCOUNTER — Other Ambulatory Visit: Payer: Self-pay | Admitting: Family Medicine

## 2017-01-09 ENCOUNTER — Ambulatory Visit (INDEPENDENT_AMBULATORY_CARE_PROVIDER_SITE_OTHER): Payer: BLUE CROSS/BLUE SHIELD | Admitting: Physician Assistant

## 2017-01-09 ENCOUNTER — Other Ambulatory Visit: Payer: Self-pay

## 2017-01-09 ENCOUNTER — Encounter: Payer: Self-pay | Admitting: Physician Assistant

## 2017-01-09 VITALS — BP 124/80 | HR 78 | Temp 98.9°F | Resp 14 | Ht 70.0 in | Wt 307.0 lb

## 2017-01-09 DIAGNOSIS — E781 Pure hyperglyceridemia: Secondary | ICD-10-CM

## 2017-01-09 DIAGNOSIS — Z0000 Encounter for general adult medical examination without abnormal findings: Secondary | ICD-10-CM | POA: Diagnosis not present

## 2017-01-09 NOTE — Patient Instructions (Signed)
Please go to the lab for blood work.   Our office will call you with your results unless you have chosen to receive results via MyChart.  If your blood work is normal we will follow-up each year for physicals and as scheduled for chronic medical problems.  If anything is abnormal we will treat accordingly and get you in for a follow-up.   Preventive Care 40-64 Years, Male Preventive care refers to lifestyle choices and visits with your health care provider that can promote health and wellness. What does preventive care include?  A yearly physical exam. This is also called an annual well check.  Dental exams once or twice a year.  Routine eye exams. Ask your health care provider how often you should have your eyes checked.  Personal lifestyle choices, including: ? Daily care of your teeth and gums. ? Regular physical activity. ? Eating a healthy diet. ? Avoiding tobacco and drug use. ? Limiting alcohol use. ? Practicing safe sex. ? Taking low-dose aspirin every day starting at age 50. What happens during an annual well check? The services and screenings done by your health care provider during your annual well check will depend on your age, overall health, lifestyle risk factors, and family history of disease. Counseling Your health care provider may ask you questions about your:  Alcohol use.  Tobacco use.  Drug use.  Emotional well-being.  Home and relationship well-being.  Sexual activity.  Eating habits.  Work and work environment.  Screening You may have the following tests or measurements:  Height, weight, and BMI.  Blood pressure.  Lipid and cholesterol levels. These may be checked every 5 years, or more frequently if you are over 50 years old.  Skin check.  Lung cancer screening. You may have this screening every year starting at age 55 if you have a 30-pack-year history of smoking and currently smoke or have quit within the past 15 years.  Fecal  occult blood test (FOBT) of the stool. You may have this test every year starting at age 50.  Flexible sigmoidoscopy or colonoscopy. You may have a sigmoidoscopy every 5 years or a colonoscopy every 10 years starting at age 50.  Prostate cancer screening. Recommendations will vary depending on your family history and other risks.  Hepatitis C blood test.  Hepatitis B blood test.  Sexually transmitted disease (STD) testing.  Diabetes screening. This is done by checking your blood sugar (glucose) after you have not eaten for a while (fasting). You may have this done every 1-3 years.  Discuss your test results, treatment options, and if necessary, the need for more tests with your health care provider. Vaccines Your health care provider may recommend certain vaccines, such as:  Influenza vaccine. This is recommended every year.  Tetanus, diphtheria, and acellular pertussis (Tdap, Td) vaccine. You may need a Td booster every 10 years.  Varicella vaccine. You may need this if you have not been vaccinated.  Zoster vaccine. You may need this after age 60.  Measles, mumps, and rubella (MMR) vaccine. You may need at least one dose of MMR if you were born in 1957 or later. You may also need a second dose.  Pneumococcal 13-valent conjugate (PCV13) vaccine. You may need this if you have certain conditions and have not been vaccinated.  Pneumococcal polysaccharide (PPSV23) vaccine. You may need one or two doses if you smoke cigarettes or if you have certain conditions.  Meningococcal vaccine. You may need this if you have certain   conditions.  Hepatitis A vaccine. You may need this if you have certain conditions or if you travel or work in places where you may be exposed to hepatitis A.  Hepatitis B vaccine. You may need this if you have certain conditions or if you travel or work in places where you may be exposed to hepatitis B.  Haemophilus influenzae type b (Hib) vaccine. You may need  this if you have certain risk factors.  Talk to your health care provider about which screenings and vaccines you need and how often you need them. This information is not intended to replace advice given to you by your health care provider. Make sure you discuss any questions you have with your health care provider. Document Released: 02/09/2015 Document Revised: 10/03/2015 Document Reviewed: 11/14/2014 Elsevier Interactive Patient Education  2017 Elsevier Inc. .      

## 2017-01-09 NOTE — Progress Notes (Signed)
Patient presents to clinic today for annual exam.  Patient is fasting for labs.  Diet -- Endorses good appetite. Is trying to eat a well-balanced diet. Does meal prep for the week. Exercise -- Gym several times per week.   Body mass index is 44.05 kg/m. Is working on weight loss with combination of cardio and resistance training.   Acute Concerns: Denies acute concerns today.  Health Maintenance: Immunizations -- Flu and Tetanus up-to-date. Colonoscopy -- Last 2014. History of Polyps. Repeat in 04/2017.  Past Medical History:  Diagnosis Date  . Colon polyps   . Crohn disease ([place])   . Hypertriglyceridemia   . Low testosterone   . OSA on CPAP     Past Surgical History:  Procedure Laterality Date  . COLON SURGERY      Current Outpatient Medications on File Prior to Visit  Medication Sig Dispense Refill  . clindamycin (CLINDAGEL) 1 % gel     . doxycycline (VIBRA-TABS) 100 MG tablet Take by mouth.    . Multiple Vitamin (MULTIVITAMIN WITH MINERALS) TABS Take 3 tablets by mouth daily.    . sildenafil (VIAGRA) 100 MG tablet Take 0.5-1 tablets (50-100 mg total) by mouth daily as needed for erectile dysfunction. 5 tablet 11  . Syringe/Needle, Disp, (SYRINGE 3CC/21GX1-1/2") 21G X 1-1/2" 3 ML MISC Give one injection weekly    . testosterone cypionate (DEPOTESTOSTERONE CYPIONATE) 200 MG/ML injection Inject into the muscle.    Marland Kitchen ULTRAM 50 MG tablet TAKE ONE TABLET BY MOUTH EVERY 8 HOURS AS NEEDED 45 tablet 0  . fenofibrate 160 MG tablet TAKE 1 TABLET DAILY (NO FURTHER REFILLS WITHOUT A CHOLESTEROL FOLLOW UP, CALL [phone] TO SCHEDULE) (Patient not taking: Reported on 01/09/2017) 30 tablet 1   No current facility-administered medications on file prior to visit.     No Known Allergies  Family History  Problem Relation Age of Onset  . Arthritis Mother   . Diabetes Mother   . Cancer Mother   . Cancer Maternal Uncle        prostate  . Cancer Maternal Grandfather    prostate  . Other Neg Hx        hypogonadism    Social History   Socioeconomic History  . Marital status: Single    Spouse name: Not on file  . Number of children: Not on file  . Years of education: Not on file  . Highest education level: Not on file  Social Needs  . Financial resource strain: Not on file  . Food insecurity - worry: Not on file  . Food insecurity - inability: Not on file  . Transportation needs - medical: Not on file  . Transportation needs - non-medical: Not on file  Occupational History  . Not on file  Tobacco Use  . Smoking status: Former Smoker    Last attempt to quit: 10/14/2011    Years since quitting: 5.2  . Smokeless tobacco: Never Used  . Tobacco comment: quit a month ago, was smoking a pack a day for 20 years, patient smoke a cigarette this am  Substance and Sexual Activity  . Alcohol use: Yes    Alcohol/week: 0.6 oz    Types: 1 Cans of beer per week    Comment: social  . Drug use: No  . Sexual activity: No  Other Topics Concern  . Not on file  Social History Narrative  . Not on file   Review of Systems  Constitutional: Negative for fever and  weight loss.  HENT: Negative for ear discharge, ear pain, hearing loss and tinnitus.   Eyes: Negative for blurred vision, double vision, photophobia and pain.  Respiratory: Negative for cough and shortness of breath.   Cardiovascular: Negative for chest pain and palpitations.  Gastrointestinal: Negative for abdominal pain, blood in stool, constipation, diarrhea, heartburn, melena, nausea and vomiting.  Genitourinary: Negative for dysuria, flank pain, frequency, hematuria and urgency.  Musculoskeletal: Negative for falls.  Neurological: Negative for dizziness, loss of consciousness and headaches.  Endo/Heme/Allergies: Negative for environmental allergies.  Psychiatric/Behavioral: Negative for depression, hallucinations, substance abuse and suicidal ideas. The patient is not nervous/anxious and does not  have insomnia.    BP 124/80   Pulse 78   Temp 98.9 F (37.2 C) (Oral)   Resp 14   Ht 5\' 10"  (1.778 m)   Wt (!) 307 lb (139.3 kg)   SpO2 96%   BMI 44.05 kg/m   Physical Exam  Constitutional: He is oriented to person, place, and time and well-developed, well-nourished, and in no distress.  HENT:  Head: Normocephalic and atraumatic.  Right Ear: External ear normal.  Left Ear: External ear normal.  Nose: Nose normal.  Mouth/Throat: Oropharynx is clear and moist. No oropharyngeal exudate.  Eyes: Conjunctivae and EOM are normal. Pupils are equal, round, and reactive to light.  Neck: Neck supple. No thyromegaly present.  Cardiovascular: Normal rate, regular rhythm, normal heart sounds and intact distal pulses.  Pulmonary/Chest: Effort normal and breath sounds normal. No respiratory distress. He has no wheezes. He has no rales. He exhibits no tenderness.  Abdominal: Soft. Bowel sounds are normal. He exhibits no distension and no mass. There is no tenderness. There is no rebound and no guarding.  Genitourinary: Testes/scrotum normal and penis normal. No discharge found.  Lymphadenopathy:    He has no cervical adenopathy.  Neurological: He is alert and oriented to person, place, and time.  Skin: Skin is warm and dry. No rash noted.  Psychiatric: Affect normal.  Vitals reviewed.  Assessment/Plan: Routine general medical examination at a health care facility Depression screen negative. Health Maintenance reviewed -- Immunizations and Colonoscopy up-to-date. Preventive schedule discussed and handout given in AVS. Will obtain fasting labs today.    Hypertriglyceridemia Repeat labs today.    Leeanne Rio, PA-C

## 2017-01-09 NOTE — Assessment & Plan Note (Signed)
Repeat labs today

## 2017-01-09 NOTE — Assessment & Plan Note (Signed)
Depression screen negative. Health Maintenance reviewed -- Immunizations and Colonoscopy up-to-date. Preventive schedule discussed and handout given in AVS. Will obtain fasting labs today.

## 2017-01-10 LAB — CBC WITH DIFFERENTIAL/PLATELET
BASOS PCT: 0.3 %
Basophils Absolute: 34 cells/uL (ref 0–200)
EOS ABS: 213 {cells}/uL (ref 15–500)
Eosinophils Relative: 1.9 %
HEMATOCRIT: 45.7 % (ref 38.5–50.0)
Hemoglobin: 16.2 g/dL (ref 13.2–17.1)
LYMPHS ABS: 1938 {cells}/uL (ref 850–3900)
MCH: 29.8 pg (ref 27.0–33.0)
MCHC: 35.4 g/dL (ref 32.0–36.0)
MCV: 84 fL (ref 80.0–100.0)
MPV: 9.6 fL (ref 7.5–12.5)
Monocytes Relative: 8.8 %
Neutro Abs: 8030 cells/uL — ABNORMAL HIGH (ref 1500–7800)
Neutrophils Relative %: 71.7 %
Platelets: 284 10*3/uL (ref 140–400)
RBC: 5.44 10*6/uL (ref 4.20–5.80)
RDW: 13.1 % (ref 11.0–15.0)
Total Lymphocyte: 17.3 %
WBC: 11.2 10*3/uL — AB (ref 3.8–10.8)
WBCMIX: 986 {cells}/uL — AB (ref 200–950)

## 2017-01-10 LAB — LIPID PANEL
CHOL/HDL RATIO: 4.7 (calc) (ref <5.0)
CHOLESTEROL: 163 mg/dL (ref <200)
HDL: 35 mg/dL — ABNORMAL LOW (ref >40)
LDL Cholesterol (Calc): 92 mg/dL (calc)
Non-HDL Cholesterol (Calc): 128 mg/dL (calc) (ref <130)
Triglycerides: 295 mg/dL — ABNORMAL HIGH (ref <150)

## 2017-01-10 LAB — COMPREHENSIVE METABOLIC PANEL
AG RATIO: 1.8 (calc) (ref 1.0–2.5)
ALBUMIN MSPROF: 4.5 g/dL (ref 3.6–5.1)
ALKALINE PHOSPHATASE (APISO): 73 U/L (ref 40–115)
ALT: 34 U/L (ref 9–46)
AST: 30 U/L (ref 10–40)
BILIRUBIN TOTAL: 0.7 mg/dL (ref 0.2–1.2)
BUN: 24 mg/dL (ref 7–25)
CALCIUM: 9.3 mg/dL (ref 8.6–10.3)
CHLORIDE: 103 mmol/L (ref 98–110)
CO2: 24 mmol/L (ref 20–32)
Creat: 1.11 mg/dL (ref 0.60–1.35)
GLOBULIN: 2.5 g/dL (ref 1.9–3.7)
Glucose, Bld: 84 mg/dL (ref 65–99)
POTASSIUM: 3.8 mmol/L (ref 3.5–5.3)
Sodium: 138 mmol/L (ref 135–146)
Total Protein: 7 g/dL (ref 6.1–8.1)

## 2017-01-12 ENCOUNTER — Other Ambulatory Visit: Payer: Self-pay | Admitting: Physician Assistant

## 2017-01-12 DIAGNOSIS — E781 Pure hyperglyceridemia: Secondary | ICD-10-CM

## 2017-01-12 DIAGNOSIS — D72829 Elevated white blood cell count, unspecified: Secondary | ICD-10-CM

## 2017-01-12 MED ORDER — FENOFIBRATE 160 MG PO TABS: 160.0000 mg | ORAL_TABLET | Freq: Every day | ORAL | 1 refills | Status: DC

## 2017-02-07 ENCOUNTER — Encounter: Payer: Self-pay | Admitting: Family Medicine

## 2017-02-09 MED ORDER — ULTRAM 50 MG PO TABS: 50.0000 mg | ORAL_TABLET | Freq: Three times a day (TID) | ORAL | 0 refills | Status: DC | PRN

## 2017-02-09 NOTE — Telephone Encounter (Signed)
Last OV 01/09/17 (CPE with [facility]) Tramadol last filled 04/21/16 #45 with 0

## 2017-05-03 ENCOUNTER — Other Ambulatory Visit: Payer: Self-pay | Admitting: Family Medicine

## 2017-05-04 NOTE — Telephone Encounter (Signed)
Last OV 01/09/2017 (CPE w/[facility]) Last filled 02/09/2017, #45 w/0 refills

## 2017-06-03 ENCOUNTER — Encounter: Payer: Self-pay | Admitting: Internal Medicine

## 2017-06-29 ENCOUNTER — Other Ambulatory Visit: Payer: Self-pay | Admitting: Physician Assistant

## 2017-06-29 DIAGNOSIS — E781 Pure hyperglyceridemia: Secondary | ICD-10-CM

## 2017-06-30 ENCOUNTER — Telehealth: Payer: Self-pay

## 2017-06-30 NOTE — Telephone Encounter (Signed)
Patient needs to have labs done for lipid panel and CBC panel. Called patient and let him know that he needs to have these done and he stated that it takes over an hour to drive here so he will call us back and give [place] the name and fax number to the lab for his work and if we can fax over the lab orders he can have them draw them at work and fax back to [place].

## 2017-09-16 ENCOUNTER — Other Ambulatory Visit: Payer: Self-pay | Admitting: Physician Assistant

## 2017-09-16 DIAGNOSIS — E781 Pure hyperglyceridemia: Secondary | ICD-10-CM

## 2017-10-27 ENCOUNTER — Other Ambulatory Visit: Payer: Self-pay | Admitting: Family Medicine

## 2017-10-27 NOTE — Telephone Encounter (Signed)
Last OV 01/09/17 Tramadol last filled 05/04/17 #45 with 0

## 2017-10-28 ENCOUNTER — Encounter: Payer: Self-pay | Admitting: General Practice

## 2017-10-28 NOTE — Telephone Encounter (Signed)
Pt has not been seen by me since June of 2017.  He had his CPE last year w/ [facility] and doesn't have any upcoming appts scheduled.  He needs to come in for a cholesterol f/u as he is on fenofibrate and needs to schedule a CPE.  Unable to fill meds until pt comes in for appt

## 2017-10-28 NOTE — Telephone Encounter (Signed)
mychart message sent to pt

## 2022-10-06 ENCOUNTER — Encounter (HOSPITAL_BASED_OUTPATIENT_CLINIC_OR_DEPARTMENT_OTHER): Payer: Self-pay | Admitting: Emergency Medicine

## 2022-10-06 ENCOUNTER — Emergency Department (HOSPITAL_BASED_OUTPATIENT_CLINIC_OR_DEPARTMENT_OTHER): Payer: Self-pay

## 2022-10-06 ENCOUNTER — Other Ambulatory Visit: Payer: Self-pay

## 2022-10-06 ENCOUNTER — Emergency Department (HOSPITAL_BASED_OUTPATIENT_CLINIC_OR_DEPARTMENT_OTHER)
Admission: EM | Admit: 2022-10-06 | Discharge: 2022-10-06 | Disposition: A | Discharge status: Home / Self Care | Payer: Worker's Compensation | Attending: Emergency Medicine | Admitting: Emergency Medicine

## 2022-10-06 DIAGNOSIS — S61210A Laceration without foreign body of right index finger without damage to nail, initial encounter: Secondary | ICD-10-CM

## 2022-10-06 DIAGNOSIS — Z23 Encounter for immunization: Secondary | ICD-10-CM | POA: Diagnosis not present

## 2022-10-06 DIAGNOSIS — Y990 Civilian activity done for income or pay: Secondary | ICD-10-CM | POA: Diagnosis not present

## 2022-10-06 DIAGNOSIS — S61212A Laceration without foreign body of right middle finger without damage to nail, initial encounter: Secondary | ICD-10-CM

## 2022-10-06 DIAGNOSIS — W232XXA Caught, crushed, jammed or pinched between a moving and stationary object, initial encounter: Secondary | ICD-10-CM | POA: Diagnosis not present

## 2022-10-06 MED ORDER — TETANUS-DIPHTH-ACELL PERTUSSIS 5-2.5-18.5 LF-MCG/0.5 IM SUSY
0.5000 mL | PREFILLED_SYRINGE | Freq: Once | INTRAMUSCULAR | Status: AC
  Administered 2022-10-06: 0.5 mL via INTRAMUSCULAR
  Filled 2022-10-06: qty 0.5

## 2022-10-06 NOTE — Discharge Instructions (Addendum)
Wear splint for the next 5 days.  Return if any problems.

## 2022-10-06 NOTE — ED Triage Notes (Signed)
Right index finger laceration occurred while at work tonight. Finger was caught on steel metal door. Unknown tetanus.

## 2022-10-07 NOTE — ED Provider Notes (Signed)
[place] [facility] Provider Note   CSN: [id number] Arrival date & time: 10/06/22  1859     History  Chief Complaint  Patient presents with   Laceration    [name] is a 52 y.o. male.  Patient reports the Of his finger was injured in a door at work.  Patient reports that the door closed scraping his finger.  Patient reports that his coworker put a bandage on the area and stop the bleeding.  Patient is unsure of the date of his last tetanus.  Patient denies any numbness or tingling.  Patient is able to use his fingers normally  The history is provided by the patient. No language interpreter was used.  Laceration      Home Medications Prior to Admission medications   Medication Sig Start Date End Date Taking? Authorizing Provider  clindamycin (CLINDAGEL) 1 % gel  11/15/16   [provider]  doxycycline (VIBRA-TABS) 100 MG tablet Take by mouth. 12/25/16   [provider]  fenofibrate 160 MG tablet TAKE 1 TABLET DAILY 09/16/17   Willow Ora, MD  Multiple Vitamin (MULTIVITAMIN WITH MINERALS) TABS Take 3 tablets by mouth daily.    [provider]  sildenafil (VIAGRA) 100 MG tablet Take 0.5-1 tablets (50-100 mg total) by mouth daily as needed for erectile dysfunction. 06/04/16   Sheliah Hatch, MD  Syringe/Needle, Disp, (SYRINGE 3CC/21GX1-1/2") 21G X 1-1/2" 3 ML MISC Give one injection weekly 05/30/16   [provider]  testosterone cypionate (DEPOTESTOSTERONE CYPIONATE) 200 MG/ML injection Inject into the muscle. 11/11/16   [provider]  traMADol (ULTRAM) 50 MG tablet TAKE 1 TABLET (50 MG TOTAL) BY MOUTH EVERY 8 (EIGHT) HOURS AS NEEDED. 05/04/17   Sheliah Hatch, MD      Allergies    Patient has no known allergies.    Review of Systems   Review of Systems  Skin:  Positive for wound.  All other systems reviewed and are negative.   Physical Exam Updated Vital Signs BP (!)  180/86 (BP Location: Left Arm)   Pulse 99   Temp 98.6 F (37 C)   Resp 18   Ht 5\' 8"  (1.727 m)   Wt (!) 154.2 kg   SpO2 96%   BMI 51.70 kg/m  Physical Exam Vitals reviewed.  HENT:     Head: Normocephalic.  Musculoskeletal:        General: Normal range of motion.  Skin:    Comments: Laceration right index finger and right 3rd finger.  No gapping nv and ns intact.  From   Neurological:     General: No focal deficit present.     Mental Status: He is alert.     ED Results / Procedures / Treatments   Labs (all labs ordered are listed, but only abnormal results are displayed) Labs Reviewed - No data to display  EKG None  Radiology DG Finger Index Right  Result Date: 10/06/2022 CLINICAL DATA:  Right index finger trauma. EXAM: RIGHT INDEX FINGER 2+V COMPARISON:  None Available. FINDINGS: Tiny bony fragment along the dorsal base of the second distal phalanx concerning for a small nondisplaced fracture. No other fracture or dislocation. No aggressive osseous lesion. Normal alignment. Soft tissue swelling of the second distal phalanx. No radiopaque foreign body or soft tissue emphysema. IMPRESSION: 1. Tiny bony fragment along the dorsal base of the second distal phalanx concerning for a small nondisplaced fracture. Electronically Signed   By:  Elige Ko M.D.   On: 10/06/2022 21:23    Procedures Procedures    Medications Ordered in ED Medications  Tdap (BOOSTRIX) injection 0.5 mL (0.5 mLs Intramuscular Given 10/06/22 2115)    ED Course/ Medical Decision Making/ A&P                                 Medical Decision Making Pt complains of a cut to left index and left 3rd finger.    Amount and/or Complexity of Data Reviewed Radiology: ordered.  Risk Prescription drug management. Risk Details: Steri strips to wound             Final Clinical Impression(s) / ED Diagnoses Final diagnoses:  Laceration of right index finger without foreign body without damage to nail,  initial encounter  Laceration of right middle finger without foreign body without damage to nail, initial encounter    Rx / DC Orders ED Discharge Orders     None      An After Visit Summary was printed and given to the patient.    Elson Areas, Cordelia Poche 10/07/22 2258    Maia Plan, MD 10/09/22 1231
